# Patient Record
Sex: Male | Born: 1945
Health system: Southern US, Community
[De-identification: ages and names within clinical notes are randomized; demographics above are authoritative.]

## PROBLEM LIST (undated history)

## (undated) DIAGNOSIS — Z8619 Personal history of other infectious and parasitic diseases: Secondary | ICD-10-CM

## (undated) DIAGNOSIS — C4491 Basal cell carcinoma of skin, unspecified: Secondary | ICD-10-CM

## (undated) DIAGNOSIS — I10 Essential (primary) hypertension: Secondary | ICD-10-CM

## (undated) DIAGNOSIS — S43006A Unspecified dislocation of unspecified shoulder joint, initial encounter: Secondary | ICD-10-CM

## (undated) DIAGNOSIS — E785 Hyperlipidemia, unspecified: Secondary | ICD-10-CM

## (undated) DIAGNOSIS — D649 Anemia, unspecified: Secondary | ICD-10-CM

## (undated) HISTORY — DX: Anemia, unspecified: D64.9

## (undated) HISTORY — PX: GLAUCOMA SURGERY: SHX656

## (undated) HISTORY — PX: CYST REMOVAL LEG: SHX6280

## (undated) HISTORY — DX: Personal history of other infectious and parasitic diseases: Z86.19

## (undated) HISTORY — DX: Unspecified dislocation of unspecified shoulder joint, initial encounter: S43.006A

## (undated) HISTORY — PX: BACK SURGERY: SHX140

## (undated) HISTORY — DX: Hyperlipidemia, unspecified: E78.5

## (undated) HISTORY — PX: HAND SURGERY: SHX662

## (undated) HISTORY — PX: COLONOSCOPY: SHX174

## (undated) HISTORY — DX: Basal cell carcinoma of skin, unspecified: C44.91

## (undated) HISTORY — PX: OTHER SURGICAL HISTORY: SHX169

## (undated) HISTORY — PX: ADENOIDECTOMY: SUR15

## (undated) HISTORY — PX: TONSILLECTOMY: SUR1361

---

## 2004-03-14 DIAGNOSIS — K5730 Diverticulosis of large intestine without perforation or abscess without bleeding: Secondary | ICD-10-CM | POA: Insufficient documentation

## 2007-05-20 ENCOUNTER — Ambulatory Visit: Payer: Self-pay

## 2009-01-24 DIAGNOSIS — G4733 Obstructive sleep apnea (adult) (pediatric): Secondary | ICD-10-CM | POA: Insufficient documentation

## 2009-11-23 ENCOUNTER — Ambulatory Visit: Payer: Self-pay | Admitting: Family Medicine

## 2010-02-01 DIAGNOSIS — N529 Male erectile dysfunction, unspecified: Secondary | ICD-10-CM | POA: Insufficient documentation

## 2011-04-03 ENCOUNTER — Ambulatory Visit (HOSPITAL_COMMUNITY): Payer: Commercial Managed Care - PPO

## 2011-04-03 ENCOUNTER — Ambulatory Visit (HOSPITAL_COMMUNITY)
Admission: RE | Admit: 2011-04-03 | Discharge: 2011-04-03 | Disposition: A | Discharge status: Home / Self Care | Payer: Commercial Managed Care - PPO | Source: Ambulatory Visit | Attending: Ophthalmology | Admitting: Ophthalmology

## 2011-04-03 DIAGNOSIS — H4010X0 Unspecified open-angle glaucoma, stage unspecified: Secondary | ICD-10-CM | POA: Insufficient documentation

## 2011-04-03 DIAGNOSIS — I10 Essential (primary) hypertension: Secondary | ICD-10-CM | POA: Insufficient documentation

## 2011-04-03 LAB — CBC
Hemoglobin: 15.4 g/dL (ref 13.0–17.0)
MCV: 91 fL (ref 78.0–100.0)
Platelets: 207 10*3/uL (ref 150–400)
RBC: 4.77 MIL/uL (ref 4.22–5.81)
WBC: 7.6 10*3/uL (ref 4.0–10.5)

## 2011-04-03 LAB — BASIC METABOLIC PANEL
CO2: 24 mEq/L (ref 19–32)
Chloride: 106 mEq/L (ref 96–112)
Glucose, Bld: 110 mg/dL — ABNORMAL HIGH (ref 70–99)
Sodium: 141 mEq/L (ref 135–145)

## 2011-04-03 LAB — SURGICAL PCR SCREEN
MRSA, PCR: NEGATIVE
Staphylococcus aureus: NEGATIVE

## 2011-06-05 ENCOUNTER — Ambulatory Visit: Payer: Self-pay | Admitting: Family Medicine

## 2011-06-13 NOTE — Op Note (Signed)
NAMEGLENDA, KUNST NO.:  [id number]  MEDICAL RECORD NO.:  [id number]  LOCATION:  SDSC                         FACILITY:  [facility]  PHYSICIAN:  Chalmers Guest, M.D.     DATE OF BIRTH:  18-Dec-1945  DATE OF PROCEDURE:  04/03/2011 DATE OF DISCHARGE:  04/03/2011                              OPERATIVE REPORT   PREOPERATIVE DIAGNOSIS:  Uncontrolled open-angle glaucoma, left eye despite maximum tolerated medical therapy and maximum laser therapy.  POSTOPERATIVE DIAGNOSIS:  Uncontrolled open-angle glaucoma, left eye despite maximum tolerated medical therapy and maximum laser therapy.  PROCEDURE:  Express mini glaucoma shunt with mitomycin C.  COMPLICATIONS:  None.  ANESTHESIA:  2% Xylocaine with epinephrine and 50:50 mixture 0.75% Marcaine with an ampule of Wydase.  PROCEDURE IN DETAIL:  The patient was given a peribulbar block with the aforementioned local anesthetic agent in the operating room under monitored anesthesia.  Pressure was applied to the eye and the patient's face was prepped and draped in usual sterile fashion.  Following this with the surgeon sitting superiorly and a lid speculum in place, a 6-0 silk suture was passed through clear cornea to infraduct the eye. With the eye in infraductal position, an incision was made with a Westcott scissors at the corneal limbus extending laterally superior nasal. Following this, using a blunt scissors a fornix-based conjunctival flap was formed.  The patient felt a sensation therefore the block was supplemented on the posterior globe subconjunctivally.  Following this, the Tenon's fibers were recessed with a Tooke blade, bleeding was controlled with cautery, 45-degree blade was used to fashion a partial thickness scleral flap.  Following this, the Maumenee-Colibri forceps were used and a Grieshaber 5700 blade was used to dissect up to the corneoscleral limbus.  Again bleeding was controlled with  cautery. Following this, mitomycin C 0.4 mg/mL was placed on Gelfoam sponge and allowed to stay under the conjunctiva for 2 minutes and then removed and irrigated with 40 mL of balanced salt solution.  At this point, a wheeler blade was used through the inferior temporal clear cornea to the anterior chamber and Provisc was injected.  Following this, a 26-gauge needle was used on the scleral flap, passed into the anterior chamber injecting Provisc.  Then the Express mini shunt which was preloaded on EDS P200 shunt SN #[id number] was passed through the tract and rotated into position and released.  The scleral flap was then sutured with 4 interrupted 10-0 nylon suture.  The conjunctiva was closed with 9-0 Vicryl on BV 100 needle securing centrally.  BSS was injected.  Anterior chamber remained deep.  The shunt was in good position, slightly resting on the iris.  The bleb elevated was Seidel negative.  A subconjunctival injection of Kenalog 4 mg was given to inferior nasal quadrant.  Topical TobraDex was applied to the eye.  A patch of Fox shield were placed after all instrumentation had been removed from the eye and the patient returned to recovery area in stable condition.     Chalmers Guest, M.D.     RW/MEDQ  D:  04/03/2011  T:  04/04/2011  Job:  [id number]  cc:   [phone]  Electronically Signed by  Chalmers Guest M.D. on 06/13/2011 05:55:42 PM

## 2011-06-25 ENCOUNTER — Other Ambulatory Visit (HOSPITAL_COMMUNITY): Payer: Self-pay | Admitting: General Surgery

## 2011-06-25 DIAGNOSIS — N453 Epididymo-orchitis: Secondary | ICD-10-CM

## 2011-06-27 ENCOUNTER — Other Ambulatory Visit (HOSPITAL_COMMUNITY): Payer: Commercial Managed Care - PPO

## 2011-06-27 ENCOUNTER — Inpatient Hospital Stay (HOSPITAL_COMMUNITY): Admission: RE | Admit: 2011-06-27 | Payer: Commercial Managed Care - PPO | Source: Ambulatory Visit

## 2011-12-18 ENCOUNTER — Ambulatory Visit: Payer: Self-pay | Admitting: Family Medicine

## 2012-05-29 DIAGNOSIS — H269 Unspecified cataract: Secondary | ICD-10-CM | POA: Insufficient documentation

## 2012-05-29 DIAGNOSIS — K219 Gastro-esophageal reflux disease without esophagitis: Secondary | ICD-10-CM | POA: Insufficient documentation

## 2012-05-29 DIAGNOSIS — H409 Unspecified glaucoma: Secondary | ICD-10-CM | POA: Insufficient documentation

## 2012-09-13 IMAGING — CR DG CHEST 2V
2 series · 2 of 2 positions shown · non-contrast
Comparison: None.

CLINICAL DATA: Preop.

CHEST - 2 VIEW

[view not recorded (1 of 2)]
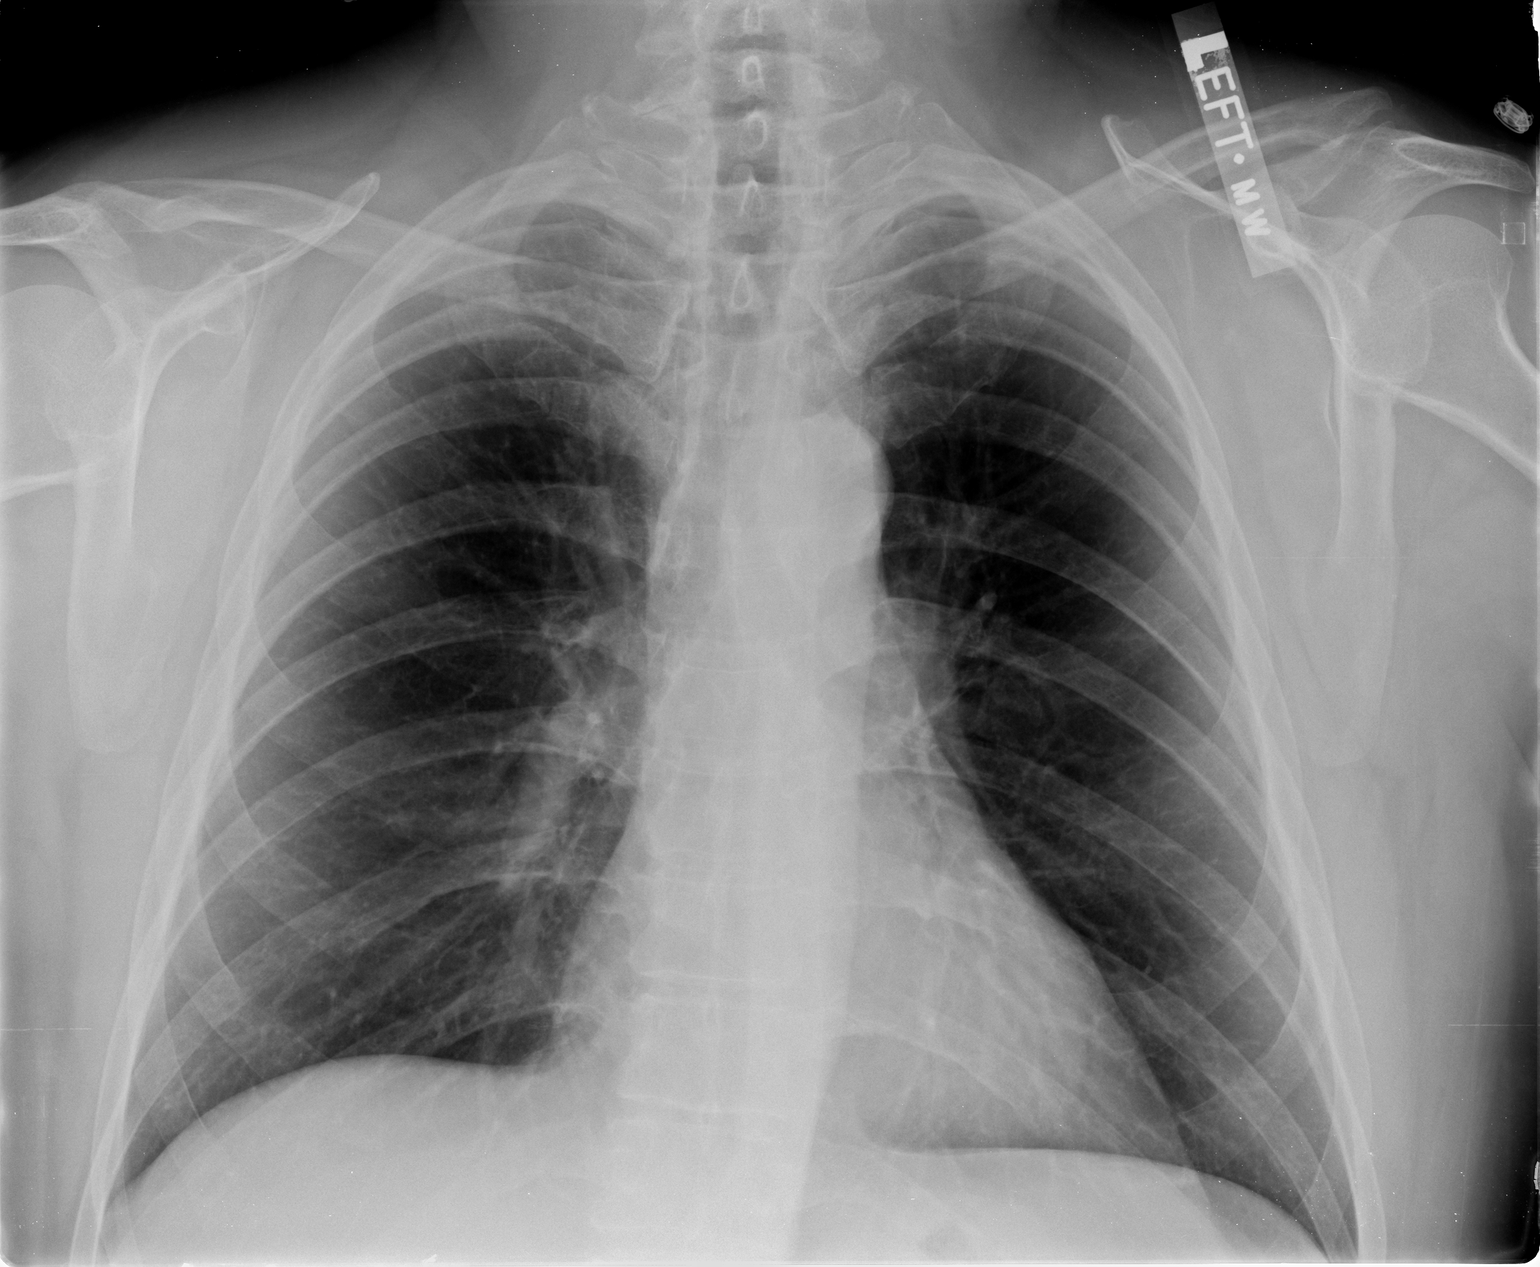

[view not recorded (2 of 2)]
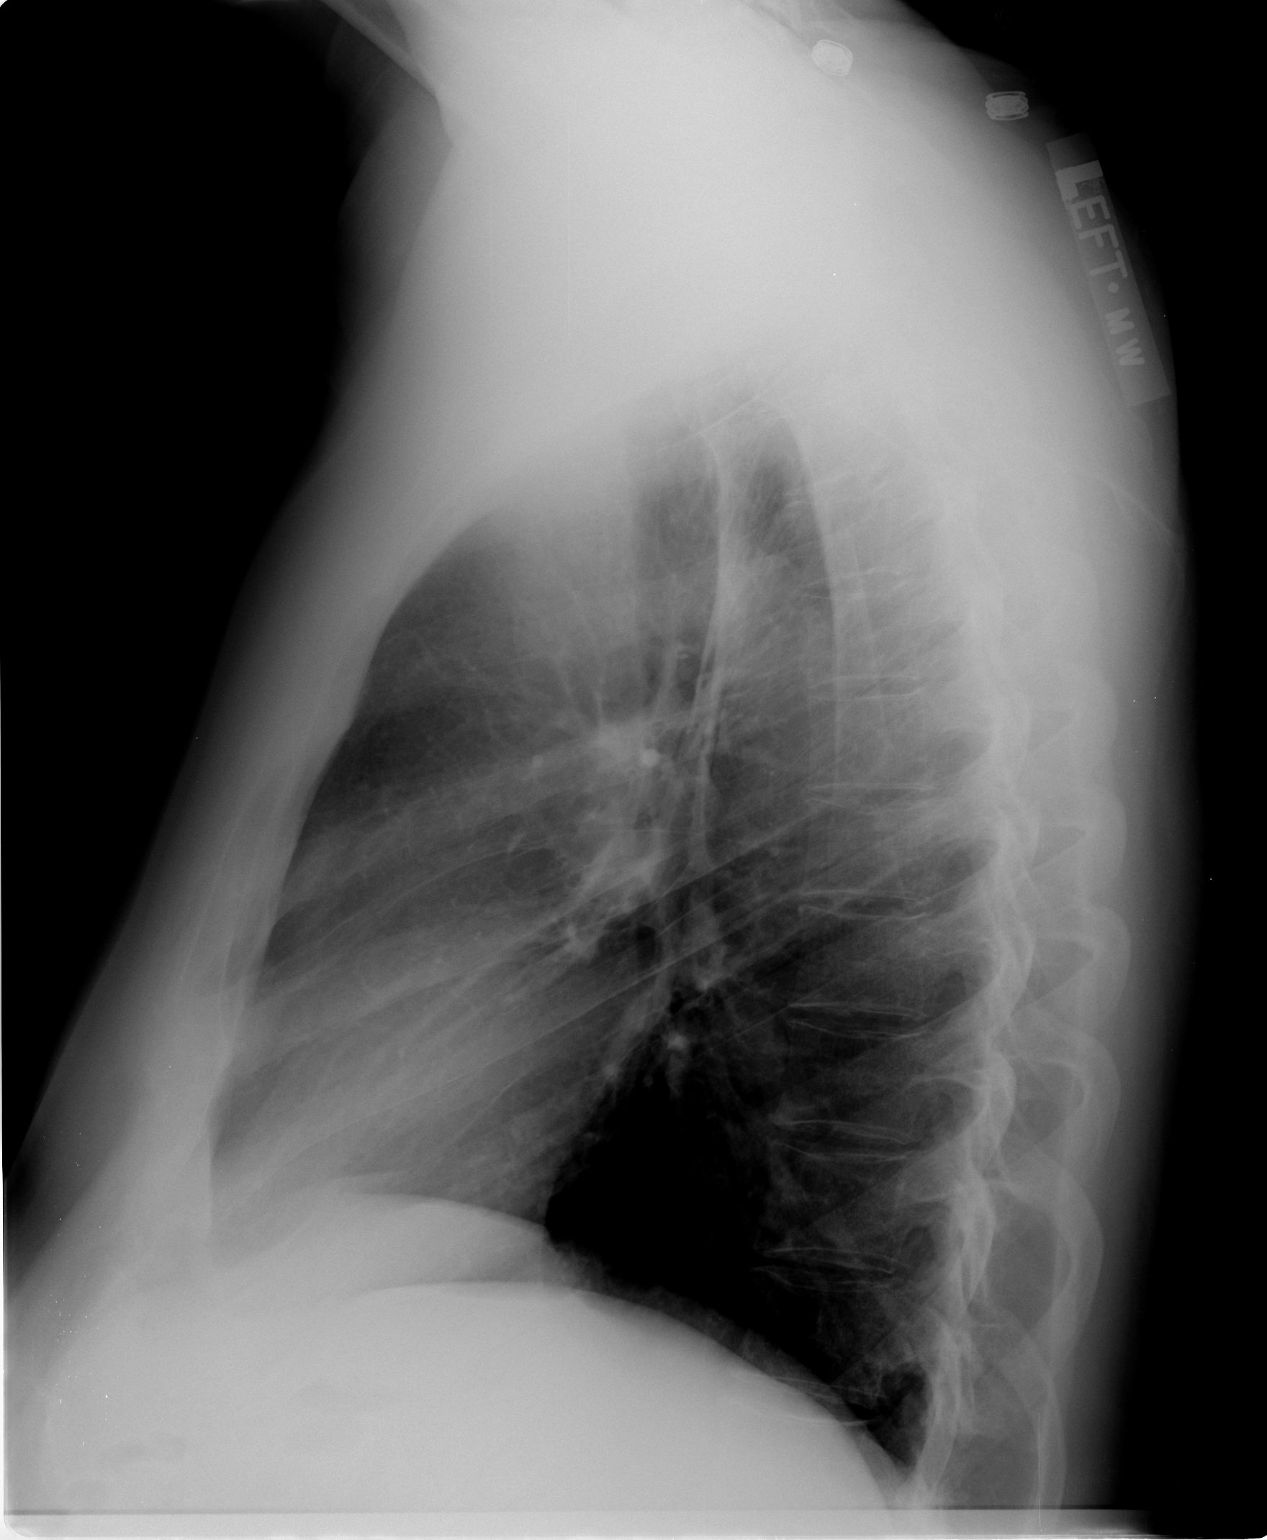

[2 of 2 positions shown; findings below may reference images not displayed]

FINDINGS: Trachea is midline.  Heart size normal.  Lungs are clear.
No pleural fluid.
IMPRESSION: No acute findings.

## 2012-11-15 IMAGING — US US CAROTID DUPLEX BILAT
1 series · 17 of 24 positions shown · non-contrast
Comparison: None

REASON FOR EXAM: neck pain  facial weakness
COMMENTS:

PROCEDURE:     GURGANUS - GURGANUS CAROTID DOPPLER BILATERAL  - June 05, 2011  [DATE]
RESULT:     Indication: Neck neck pain, facial weakness
TECHNIQUE: Gray-scale, color Doppler, and spectral Doppler images were
obtained of the extracranial carotid artery systems and vertebral arteries
in the neck.

[Series 1: us carotid duplex bilat · 17 of 73 slices shown]
[im 1/73]
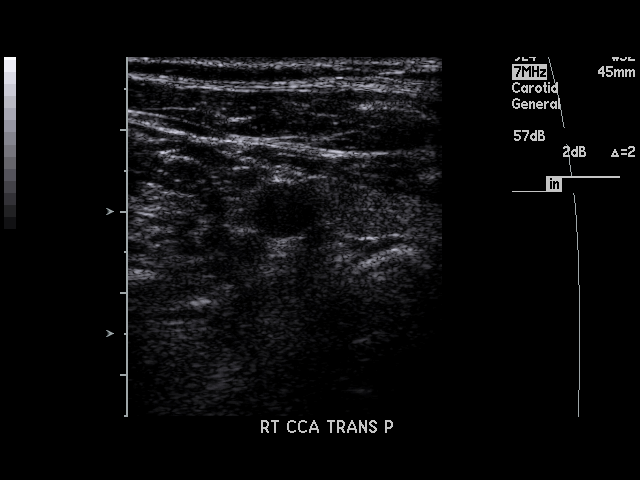
[im 7/73]
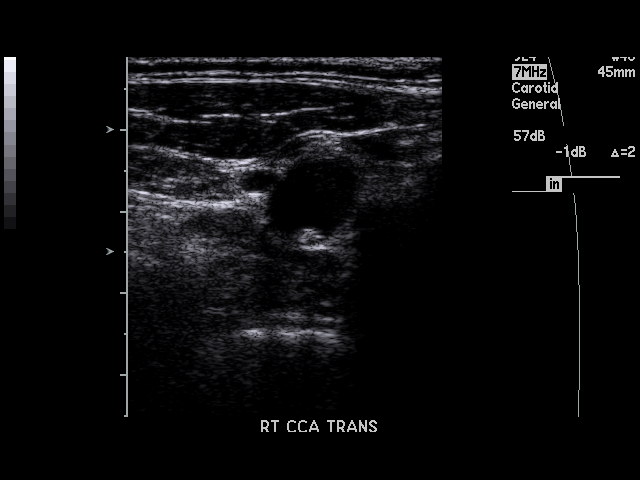
[im 10/73]
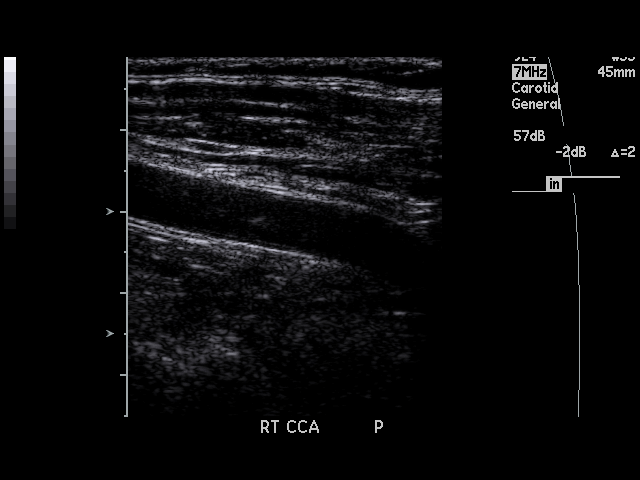
[im 13/73]
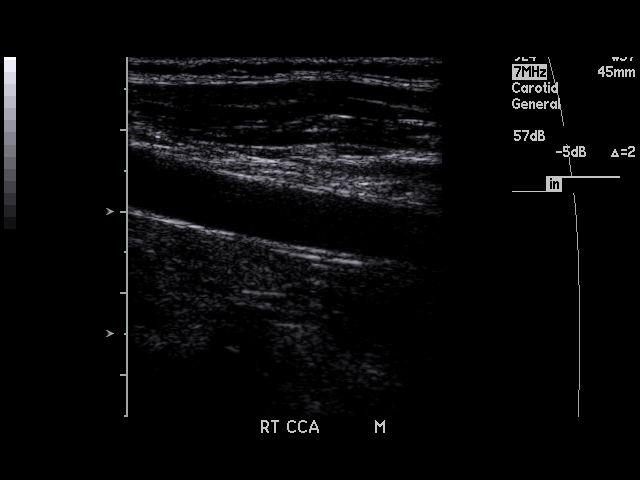
[im 19/73]
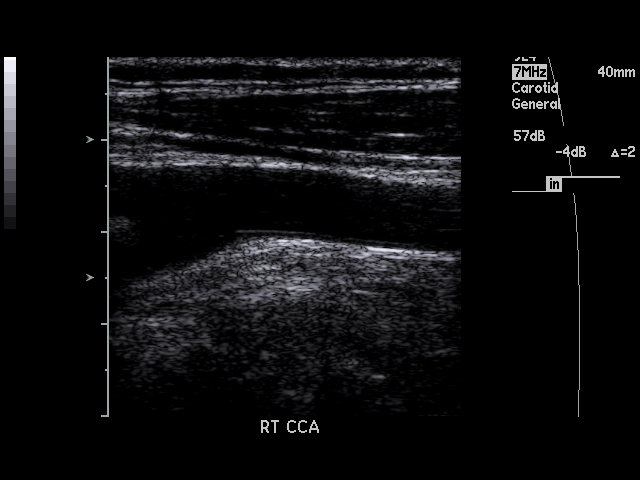
[im 22/73]
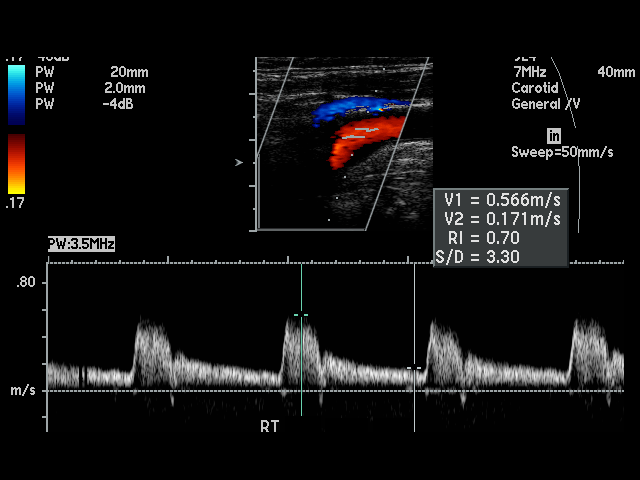
[im 29/73]
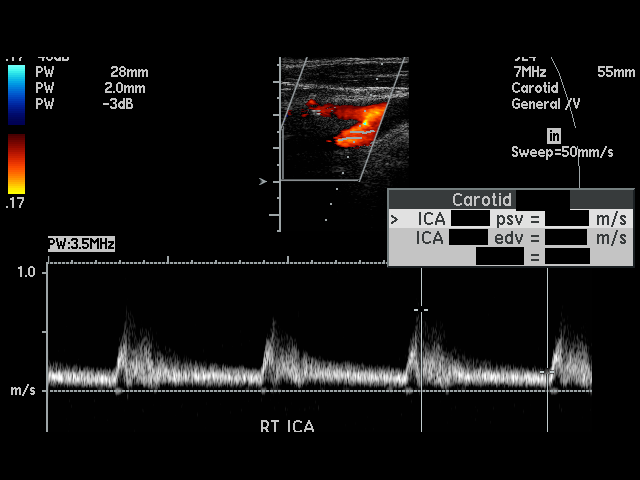
[im 32/73]
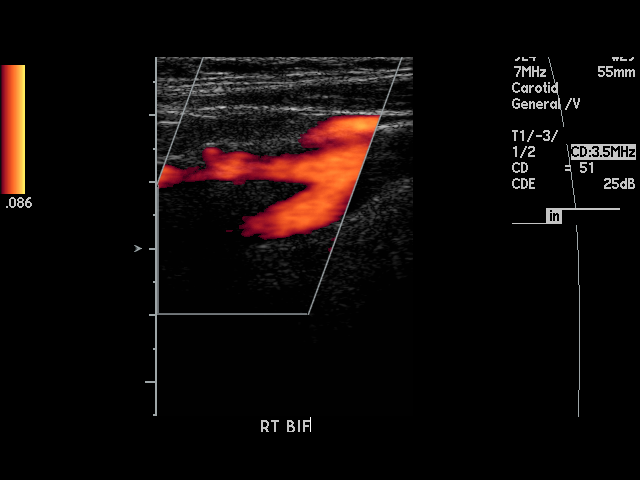
[im 38/73]
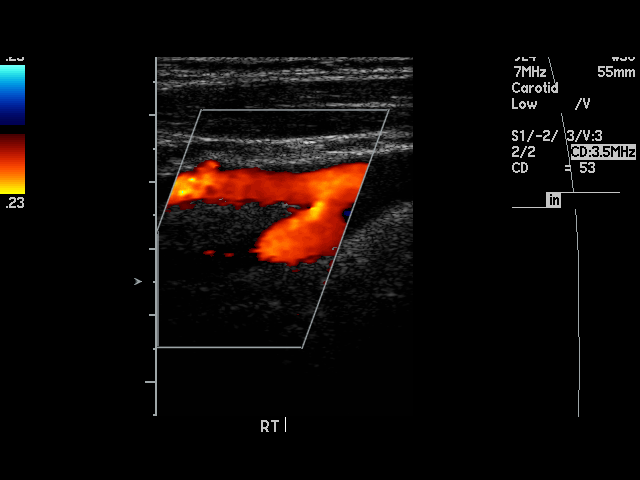
[im 41/73]
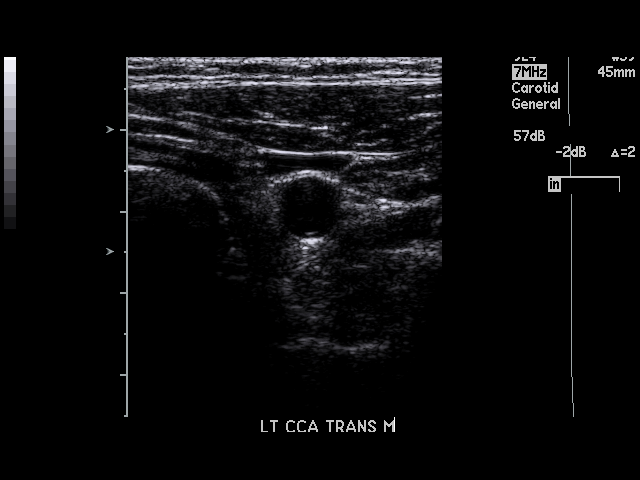
[im 44/73]
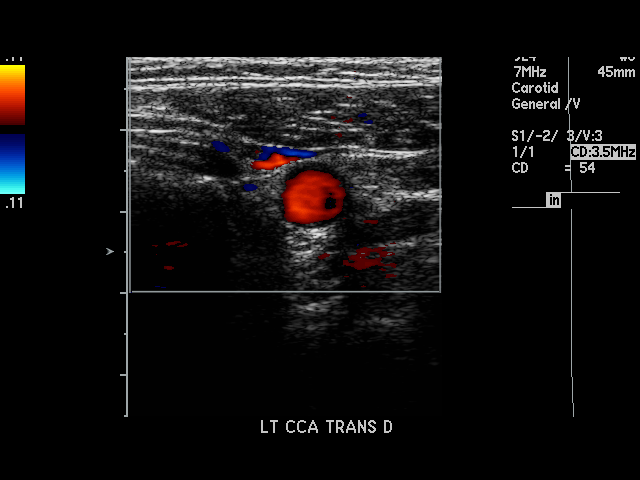
[im 51/73]
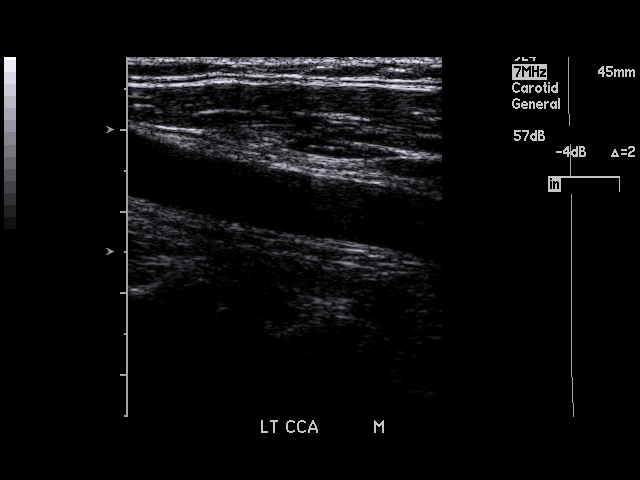
[im 54/73]
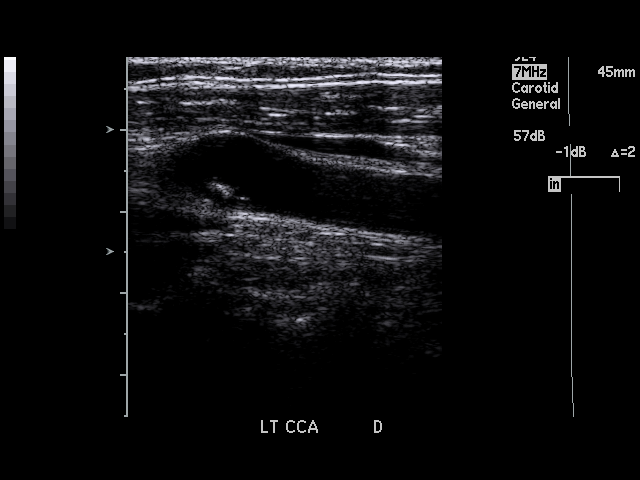
[im 60/73]
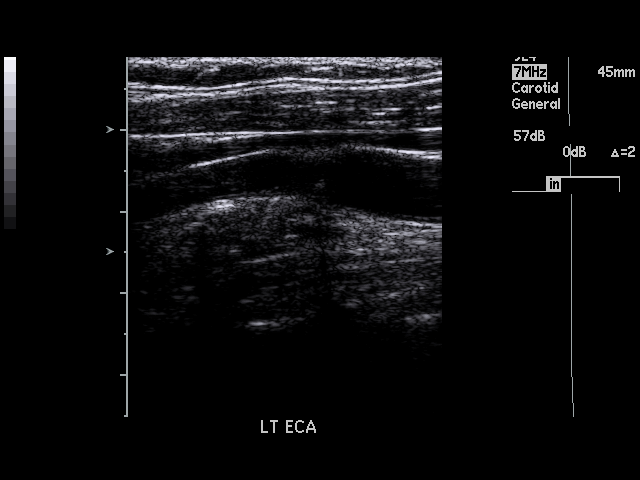
[im 63/73]
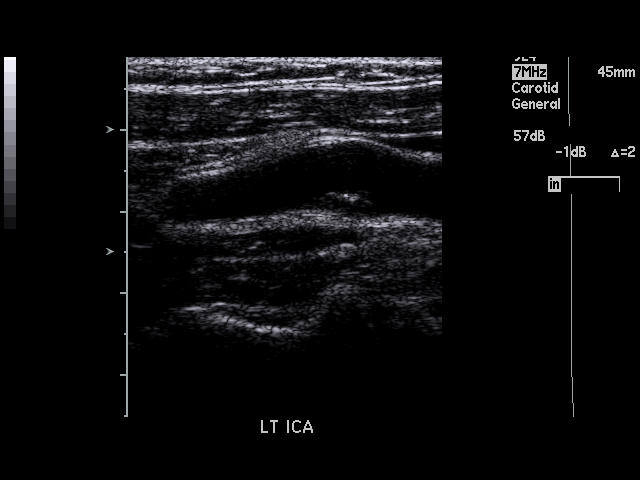
[im 66/73]
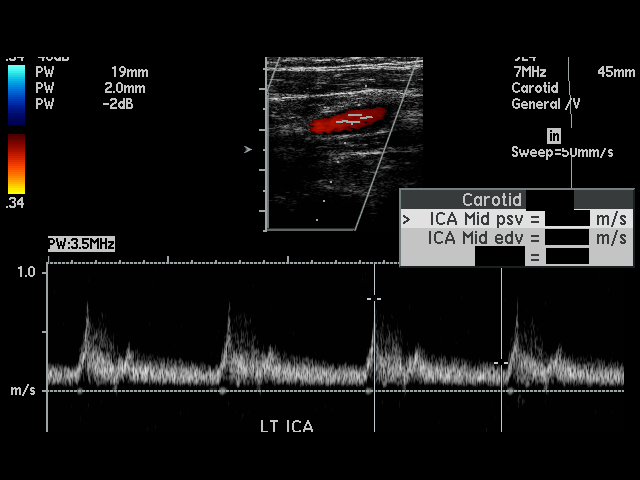
[im 73/73]
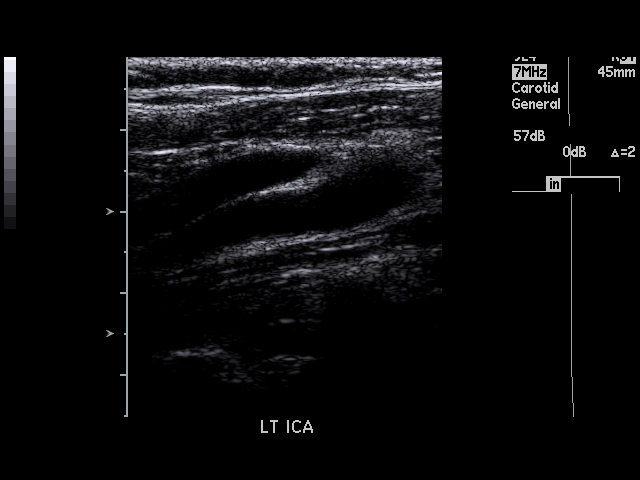

[17 of 24 positions shown; findings below may reference images not displayed]

FINDINGS: On the right, there is mild atherosclerotic plaque within the carotid bulb
and proximal ICA. Maximum peak systolic velocity in the right CCA is 83
cm/second. Maximum peak systolic velocity in the right ICA is 72 cm/second.
Maximum peak systolic velocity in the right ECA is 111 cm/second. The right
ICA/CCA ratio is 0.87. This corresponds to a stenosis of less than 50 %.
Antegrade blood flow is documented in the right vertebral artery.

On the left, there is mild atherosclerotic plaque within the carotid bulb.
Maximum peak systolic velocity in the left CCA is 72 cm/second. Maximum peak
systolic velocity in the left ICA is 74 cm/second. Maximum peak systolic
velocity in the left ECA is 93 cm/second. The left ICA/CCA ratio is 1.04.
This corresponds to a stenosis of less than 50 %. Antegrade blood flow is
documented in the left vertebral artery.
IMPRESSION: 1. No hemodynamically significant carotid artery stenosis.

## 2013-03-13 ENCOUNTER — Emergency Department (HOSPITAL_COMMUNITY)
Admission: EM | Admit: 2013-03-13 | Discharge: 2013-03-13 | Disposition: A | Discharge status: Home / Self Care | Payer: Commercial Managed Care - PPO | Attending: Emergency Medicine | Admitting: Emergency Medicine

## 2013-03-13 ENCOUNTER — Emergency Department (HOSPITAL_COMMUNITY): Payer: Commercial Managed Care - PPO

## 2013-03-13 ENCOUNTER — Encounter (HOSPITAL_COMMUNITY): Payer: Self-pay | Admitting: Emergency Medicine

## 2013-03-13 DIAGNOSIS — R0789 Other chest pain: Secondary | ICD-10-CM | POA: Insufficient documentation

## 2013-03-13 DIAGNOSIS — Z91040 Latex allergy status: Secondary | ICD-10-CM | POA: Insufficient documentation

## 2013-03-13 DIAGNOSIS — E7800 Pure hypercholesterolemia, unspecified: Secondary | ICD-10-CM | POA: Insufficient documentation

## 2013-03-13 DIAGNOSIS — Z79899 Other long term (current) drug therapy: Secondary | ICD-10-CM | POA: Insufficient documentation

## 2013-03-13 DIAGNOSIS — I10 Essential (primary) hypertension: Secondary | ICD-10-CM | POA: Insufficient documentation

## 2013-03-13 DIAGNOSIS — Z7982 Long term (current) use of aspirin: Secondary | ICD-10-CM | POA: Insufficient documentation

## 2013-03-13 HISTORY — DX: Essential (primary) hypertension: I10

## 2013-03-13 LAB — CBC WITH DIFFERENTIAL/PLATELET
Basophils Absolute: 0 10*3/uL (ref 0.0–0.1)
HCT: 45.4 % (ref 39.0–52.0)
Hemoglobin: 16.1 g/dL (ref 13.0–17.0)
Lymphocytes Relative: 24 % (ref 12–46)
Lymphs Abs: 2.1 10*3/uL (ref 0.7–4.0)
Monocytes Absolute: 0.7 10*3/uL (ref 0.1–1.0)
Monocytes Relative: 8 % (ref 3–12)
Neutro Abs: 5.8 10*3/uL (ref 1.7–7.7)
RBC: 4.92 MIL/uL (ref 4.22–5.81)
RDW: 12.8 % (ref 11.5–15.5)
WBC: 8.7 10*3/uL (ref 4.0–10.5)

## 2013-03-13 LAB — BASIC METABOLIC PANEL
CO2: 25 mEq/L (ref 19–32)
Chloride: 103 mEq/L (ref 96–112)
GFR calc Af Amer: >90 mL/min (ref >90)
Potassium: 4 mEq/L (ref 3.5–5.1)
Sodium: 137 mEq/L (ref 135–145)

## 2013-03-13 MED ORDER — ASPIRIN 81 MG PO CHEW
324.0000 mg | CHEWABLE_TABLET | Freq: Once | ORAL | Status: AC
  Administered 2013-03-13: 324 mg via ORAL
  Filled 2013-03-13: qty 4

## 2013-03-13 NOTE — ED Notes (Signed)
Pt returned from X-ray.  

## 2013-03-13 NOTE — ED Provider Notes (Signed)
History    CSN: [id number] Arrival date & time 03/13/13  1049  First MD Initiated Contact with Patient 03/13/13 1102     Chief Complaint  Patient presents with  . Chest Pain  . Hypertension   (Consider location/radiation/quality/duration/timing/severity/associated sxs/prior Treatment) HPI Comments: 67 yo male with HTN hx, no other cardiac risk factors, no PE risk factors presents with left anterior chest vibration intermittent since yesterday. No palpitations or pain/ pressure.  No hx of similar.  No exertional or diaphoretic component, no sob or pain. Patient denies blood clot history, active cancer, recent major trauma or surgery, unilateral leg swelling/ pain, recent long travel, hemoptysis or oral contraceptives. Pt feels great, no sxs when he exercises regularly.  No known heart issues.  Occurs every 20 min and lasts seconds, superficial feeling.   Patient is a 67 y.o. male presenting with chest pain and hypertension. The history is provided by the patient.  Chest Pain Pain location:  L chest Associated symptoms: no abdominal pain, no back pain, no cough, no fever, no headache, no shortness of breath and not vomiting   Hypertension Associated symptoms include chest pain. Pertinent negatives include no abdominal pain, no headaches and no shortness of breath.   Past Medical History  Diagnosis Date  . Hypertension   . Hypercholesteremia    History reviewed. No pertinent past surgical history. History reviewed. No pertinent family history. History  Substance Use Topics  . Smoking status: Never Smoker   . Smokeless tobacco: Not on file  . Alcohol Use: Yes    Review of Systems  Constitutional: Negative for fever and chills.  HENT: Negative for neck pain and neck stiffness.   Eyes: Negative for visual disturbance.  Respiratory: Negative for cough and shortness of breath.   Cardiovascular: Positive for chest pain. Negative for leg swelling.  Gastrointestinal: Negative for  vomiting and abdominal pain.  Genitourinary: Negative for dysuria and flank pain.  Musculoskeletal: Negative for back pain.  Skin: Negative for rash.  Neurological: Negative for light-headedness and headaches.    Allergies  Latex  Home Medications   Current Outpatient Rx  Name  Route  Sig  Dispense  Refill  . aspirin 81 MG tablet   Oral   Take 81 mg by mouth daily.         . bisoprolol-hydrochlorothiazide (ZIAC) 2.5-6.25 MG per tablet   Oral   Take 1 tablet by mouth every evening.         . cetirizine (ZYRTEC) 10 MG tablet   Oral   Take 5 mg by mouth daily.         . famotidine (PEPCID) 10 MG tablet   Oral   Take 10 mg by mouth at bedtime.         . fluvastatin (LESCOL) 20 MG capsule   Oral   Take 20 mg by mouth at bedtime.         Marland Kitchen glucosamine-chondroitin 500-400 MG tablet   Oral   Take 1 tablet by mouth 2 (two) times daily.         . Multiple Vitamin (MULTIVITAMIN WITH MINERALS) TABS   Oral   Take 1 tablet by mouth daily.         . Omega-3 Fatty Acids (FISH OIL) 1200 MG CAPS   Oral   Take 2,400 mg by mouth 2 (two) times daily.         . Potassium (POTASSIMIN PO)   Oral   Take 500 mg by mouth  daily.         . vitamin B-12 (CYANOCOBALAMIN) 500 MCG tablet   Oral   Take 500 mcg by mouth daily.          BP 210/166  Pulse 57  Temp(Src) 98.1 F (36.7 C) (Oral)  Resp 22  SpO2 97% Physical Exam  Nursing note and vitals reviewed. Constitutional: He is oriented to person, place, and time. He appears well-developed and well-nourished.  HENT:  Head: Normocephalic and atraumatic.  Eyes: Conjunctivae are normal. Right eye exhibits no discharge. Left eye exhibits no discharge.  Neck: Normal range of motion. Neck supple. No tracheal deviation present.  Cardiovascular: Normal rate, regular rhythm and intact distal pulses.   No murmur heard. Pulmonary/Chest: Effort normal and breath sounds normal.  Abdominal: Soft. He exhibits no distension.  There is no tenderness. There is no guarding.  Musculoskeletal: He exhibits no edema and no tenderness.  Neurological: He is alert and oriented to person, place, and time.  Skin: Skin is warm. No rash noted.  Psychiatric: He has a normal mood and affect.    ED Course  Procedures (including critical care time) Labs Reviewed  BASIC METABOLIC PANEL - Abnormal; Notable for the following:    Glucose, Bld 102 (*)    GFR calc non Af Amer 86 (*)    All other components within normal limits  CBC WITH DIFFERENTIAL  TROPONIN I  TROPONIN I   Dg Chest 2 View  03/13/2013   *RADIOLOGY REPORT*  Clinical Data: Chest pain, hypertension, feels vibrations in chest  CHEST - 2 VIEW  Comparison: 04/03/2011  Findings: The heart size and vascular pattern are normal and unchanged from prior study.  The lungs are clear.  There are no pleural effusions.  Mediastinal contours are normal.  IMPRESSION: Normal study   Original Report Authenticated By: Esperanza Heir, M.D.   1. Chest discomfort     MDM  No zoster rash. ASA given. Work up in ED unremarkable.  Rechecked twice, no sxs.  Date: 03/13/2013  Rate: 55  Rhythm: normal sinus rhythm  QRS Axis: normal  Intervals: normal  ST/T Wave abnormalities: normal  Conduction Disutrbances:none  Narrative Interpretation:   Old EKG Reviewed: unchanged  Discussed broad differential for atypical vibrations sense, no pain in ed or sob.  Initial htn read likely not accurate, multiple rechecks showed normal or near normal bp.  Pt will fup for recheck for bp. Discussed observation vs close outpt fup, pt understands this may be his heart, he has capacity to make Decisions and prefers close outpt fup for stress test and re-eval. Delta trop neg.  DC Rec asa until seen.    Enid Skeens, MD 03/13/13 [phone]

## 2013-03-13 NOTE — ED Notes (Signed)
Pt c/o left sided chest pressure; pt denies pain but sts feels funny; pt sts some exertional SOB; pt noted to be hypertensive; pt sts normally under control with meds

## 2013-03-18 ENCOUNTER — Encounter: Payer: Self-pay | Admitting: *Deleted

## 2013-03-19 ENCOUNTER — Telehealth: Payer: Self-pay | Admitting: *Deleted

## 2013-03-19 ENCOUNTER — Ambulatory Visit (INDEPENDENT_AMBULATORY_CARE_PROVIDER_SITE_OTHER): Payer: Commercial Managed Care - PPO | Admitting: Cardiovascular Disease

## 2013-03-19 ENCOUNTER — Institutional Professional Consult (permissible substitution): Payer: Self-pay | Admitting: Neurology

## 2013-03-19 ENCOUNTER — Encounter: Payer: Self-pay | Admitting: Cardiovascular Disease

## 2013-03-19 VITALS — BP 108/72 | HR 53 | Ht 74.0 in | Wt 214.8 lb

## 2013-03-19 DIAGNOSIS — R079 Chest pain, unspecified: Secondary | ICD-10-CM | POA: Insufficient documentation

## 2013-03-19 DIAGNOSIS — I4892 Unspecified atrial flutter: Secondary | ICD-10-CM

## 2013-03-19 DIAGNOSIS — R002 Palpitations: Secondary | ICD-10-CM | POA: Insufficient documentation

## 2013-03-19 DIAGNOSIS — I10 Essential (primary) hypertension: Secondary | ICD-10-CM | POA: Insufficient documentation

## 2013-03-19 NOTE — Telephone Encounter (Signed)
Message copied by Kendrick Fries on Fri Mar 19, 2013  3:48 PM ------      Message from: Lisabeth Devoid F      Created: Fri Mar 19, 2013  3:37 PM      Regarding: 48 hour monitor       48 hour monitor      :) ------

## 2013-03-19 NOTE — Assessment & Plan Note (Signed)
His symptoms are suggestive of premature beats. Baseline ECG is normal. I recommend a 48 hour work to monitor for evaluation. I don't period of monitoring can be considered in the future if he develops recurrent symptoms with no definite diagnosis.

## 2013-03-19 NOTE — Assessment & Plan Note (Signed)
Blood pressure was previously elevated during his emergency room visit. However, it has been back to normal since then.

## 2013-03-19 NOTE — Telephone Encounter (Signed)
Faxed order for 48 hour holter monitor to [place]. They will contact pt to have pt come in for 48 hour holter monitor.

## 2013-03-19 NOTE — Assessment & Plan Note (Signed)
He has a chest discomfort with palpitations. I think it's important to rule out ischemic heart disease as an etiology. Thus, I recommend a stress echocardiogram. The imaging portion will help determine his cardiac function and ensure no significant structural abnormalities.

## 2013-03-19 NOTE — Patient Instructions (Addendum)
Your physician has requested that you have a stress echocardiogram. For further information please visit [email or web address] Please follow instruction sheet as given.  Your physician has recommended that you wear a holter monitor. Holter monitors are medical devices that record the heart's electrical activity. Doctors most often use these monitors to diagnose arrhythmias. Arrhythmias are problems with the speed or rhythm of the heartbeat. The monitor is a small, portable device. You can wear one while you do your normal daily activities. This is usually used to diagnose what is causing palpitations/syncope (passing out).  Follow up as needed

## 2013-03-19 NOTE — Progress Notes (Signed)
HPI  This is a pleasant 67 year old man who was referred by Dr. Sherrie Mustache for evaluation of palpitations and chest pain. He has no previous cardiac history. He has known history of hypertension and hyperlipidemia which are both well controlled on medications. He has no history of diabetes or tobacco use. Recently, specifically on July 19, he started having intermittent episodes of fluttering in his chest and "vibrations"  Lasting for short period of time and associated with vague discomfort substernally. He had no shortness of breath, dizziness, syncope or presyncope. These episodes happen at rest and did not worsen with physical activities. He went to the emergency room at [facility]. Routine labs were unremarkable. EKG showed no acute changes. Cardiac enzymes were negative. He was noted to be hypertensive with a blood pressure greater than 200 which came down spontaneously. He was discharged home to followup with cardiology. Since hospital discharge, he reports no recurrent symptoms. She has no history of thyroid disease. He consumes 1-2 cups of coffee a day.  Allergies  Allergen Reactions  . Latex Itching  . Lipitor (Atorvastatin)   . Pravachol (Pravastatin Sodium)      Current Outpatient Prescriptions on File Prior to Visit  Medication Sig Dispense Refill  . aspirin 81 MG tablet Take 81 mg by mouth daily.      . bisoprolol-hydrochlorothiazide (ZIAC) 2.5-6.25 MG per tablet Take 1 tablet by mouth every evening.      . cetirizine (ZYRTEC) 10 MG tablet Take 5 mg by mouth daily.      . famotidine (PEPCID) 10 MG tablet Take 10 mg by mouth at bedtime.      . fluvastatin (LESCOL) 20 MG capsule Take 20 mg by mouth at bedtime.      Marland Kitchen glucosamine-chondroitin 500-400 MG tablet Take 1 tablet by mouth 2 (two) times daily.      . Multiple Vitamin (MULTIVITAMIN WITH MINERALS) TABS Take 1 tablet by mouth daily.      . Potassium (POTASSIMIN PO) Take 595 mg by mouth 2 (two) times daily.        No current  facility-administered medications on file prior to visit.     Past Medical History  Diagnosis Date  . Hypertension   . Hypercholesteremia   . Arthritis   . Facial weakness   . Acute neck pain   . Glaucoma   . Diverticula of colon   . Cancer     basel cell skin cancer     Past Surgical History  Procedure Laterality Date  . Back surgery    . Tonsillectomy    . Adenoidectomy    . Hand surgery    . Glaucoma surgery    . Cataract surgery    . Colonoscopy    . Bone graph    . Cyst removal leg Left      Family History  Problem Relation Age of Onset  . Heart disease Sister      History   Social History  . Marital Status: Married    Spouse Name: N/A    Number of Children: N/A  . Years of Education: N/A   Occupational History  . Not on file.   Social History Main Topics  . Smoking status: Former Smoker -- 0.00 packs/day for 7 years    Types: Cigarettes  . Smokeless tobacco: Not on file  . Alcohol Use: 1.8 oz/week    3 Cans of beer per week     Comment: daily  . Drug Use: No  .  Sexually Active: Not on file   Other Topics Concern  . Not on file   Social History Narrative  . No narrative on file     ROS Constitutional: Negative for fever, chills, diaphoresis, activity change, appetite change and fatigue.  HENT: Negative for hearing loss, nosebleeds, congestion, sore throat, facial swelling, drooling, trouble swallowing, neck pain, voice change, sinus pressure and tinnitus.  Eyes: Negative for photophobia, pain, discharge and visual disturbance.  Respiratory: Negative for apnea, cough, shortness of breath and wheezing.  Cardiovascular: Negative for leg swelling.  Gastrointestinal: Negative for nausea, vomiting, abdominal pain, diarrhea, constipation, blood in stool and abdominal distention.  Genitourinary: Negative for dysuria, urgency, frequency, hematuria and decreased urine volume.  Musculoskeletal: Negative for myalgias, back pain, joint swelling,  arthralgias and gait problem.  Skin: Negative for color change, pallor, rash and wound.  Neurological: Negative for dizziness, tremors, seizures, syncope, speech difficulty, weakness, light-headedness, numbness and headaches.  Psychiatric/Behavioral: Negative for suicidal ideas, hallucinations, behavioral problems and agitation. The patient is not nervous/anxious.     PHYSICAL EXAM   BP 108/72  Pulse 53  Ht 6\' 2"  (1.88 m)  Wt 214 lb 12 oz (97.41 kg)  BMI 27.56 kg/m2 Constitutional: He is oriented to person, place, and time. He appears well-developed and well-nourished. No distress.  HENT: No nasal discharge.  Head: Normocephalic and atraumatic.  Eyes: Pupils are equal and round. Right eye exhibits no discharge. Left eye exhibits no discharge.  Neck: Normal range of motion. Neck supple. No JVD present. No thyromegaly present.  Cardiovascular: Normal rate, regular rhythm, normal heart sounds and. Exam reveals no gallop and no friction rub. No murmur heard.  Pulmonary/Chest: Effort normal and breath sounds normal. No stridor. No respiratory distress. He has no wheezes. He has no rales. He exhibits no tenderness.  Abdominal: Soft. Bowel sounds are normal. He exhibits no distension. There is no tenderness. There is no rebound and no guarding.  Musculoskeletal: Normal range of motion. He exhibits no edema and no tenderness.  Neurological: He is alert and oriented to person, place, and time. Coordination normal.  Skin: Skin is warm and dry. No rash noted. He is not diaphoretic. No erythema. No pallor.  Psychiatric: He has a normal mood and affect. His behavior is normal. Judgment and thought content normal.       ZOX:WRUEA  Bradycardia  WITHIN NORMAL LIMITS   ASSESSMENT AND PLAN

## 2013-03-22 ENCOUNTER — Other Ambulatory Visit (HOSPITAL_COMMUNITY): Payer: Commercial Managed Care - PPO

## 2013-03-22 ENCOUNTER — Ambulatory Visit: Payer: Commercial Managed Care - PPO | Admitting: Cardiology

## 2013-03-23 ENCOUNTER — Ambulatory Visit (HOSPITAL_COMMUNITY): Payer: Commercial Managed Care - PPO | Attending: Cardiology

## 2013-03-23 ENCOUNTER — Ambulatory Visit (HOSPITAL_BASED_OUTPATIENT_CLINIC_OR_DEPARTMENT_OTHER): Payer: Commercial Managed Care - PPO

## 2013-03-23 ENCOUNTER — Other Ambulatory Visit (HOSPITAL_COMMUNITY): Payer: Self-pay | Admitting: *Deleted

## 2013-03-23 DIAGNOSIS — Z87891 Personal history of nicotine dependence: Secondary | ICD-10-CM | POA: Insufficient documentation

## 2013-03-23 DIAGNOSIS — R0989 Other specified symptoms and signs involving the circulatory and respiratory systems: Secondary | ICD-10-CM

## 2013-03-23 DIAGNOSIS — I10 Essential (primary) hypertension: Secondary | ICD-10-CM | POA: Insufficient documentation

## 2013-03-23 DIAGNOSIS — R002 Palpitations: Secondary | ICD-10-CM | POA: Insufficient documentation

## 2013-03-23 DIAGNOSIS — H409 Unspecified glaucoma: Secondary | ICD-10-CM | POA: Insufficient documentation

## 2013-03-23 DIAGNOSIS — R079 Chest pain, unspecified: Secondary | ICD-10-CM

## 2013-03-23 DIAGNOSIS — E785 Hyperlipidemia, unspecified: Secondary | ICD-10-CM | POA: Insufficient documentation

## 2013-03-23 DIAGNOSIS — R072 Precordial pain: Secondary | ICD-10-CM

## 2013-03-23 MED ORDER — PERFLUTREN PROTEIN A MICROSPH IV SUSP
0.5000 mL | Freq: Once | INTRAVENOUS | Status: AC
  Administered 2013-03-23: 2 mL via INTRAVENOUS

## 2013-03-23 NOTE — Progress Notes (Signed)
Echocardiogram stress performed.

## 2013-03-24 ENCOUNTER — Encounter: Payer: Self-pay | Admitting: Cardiology

## 2013-03-26 DIAGNOSIS — R002 Palpitations: Secondary | ICD-10-CM

## 2013-04-01 ENCOUNTER — Telehealth: Payer: Self-pay

## 2013-04-01 NOTE — Telephone Encounter (Signed)
Pt would like results from monitor  °

## 2013-04-05 NOTE — Telephone Encounter (Signed)
Dr Kirke Corin is out of town for the week.  Had Dr Mariah Milling review holter monitor results.  Monitor showed some PVC's, but no significant arrhythmias. Rate was bradycardic in the 40's. Will have Dr Kirke Corin review when he returns from [place] and call back with any further recommendations.  LMOM TCB.  Spoke with pt advised of above.  Pt advised of above.  Will await Call back with Dr Jari Sportsman recommendations.

## 2013-04-06 NOTE — Telephone Encounter (Signed)
Called spoke with pt DR Kirke Corin read monitor results NSR, mild sinus bradycardia, few PVC's and PAC's, no significant arrhythmias.  Advised pt TCB if symptomatic with low HR for appt to discuss tx options. Pt verbalized understanding.

## 2013-04-07 ENCOUNTER — Encounter (INDEPENDENT_AMBULATORY_CARE_PROVIDER_SITE_OTHER): Payer: Commercial Managed Care - PPO

## 2013-04-07 DIAGNOSIS — R002 Palpitations: Secondary | ICD-10-CM

## 2013-05-13 ENCOUNTER — Encounter: Payer: Self-pay | Admitting: Neurology

## 2013-05-14 ENCOUNTER — Encounter: Payer: Self-pay | Admitting: *Deleted

## 2013-05-14 ENCOUNTER — Encounter: Payer: Self-pay | Admitting: Neurology

## 2013-05-14 ENCOUNTER — Ambulatory Visit (INDEPENDENT_AMBULATORY_CARE_PROVIDER_SITE_OTHER): Payer: Commercial Managed Care - PPO | Admitting: Neurology

## 2013-05-14 VITALS — BP 126/76 | Ht 73.0 in | Wt 217.0 lb

## 2013-05-14 DIAGNOSIS — R0683 Snoring: Secondary | ICD-10-CM | POA: Insufficient documentation

## 2013-05-14 DIAGNOSIS — G4710 Hypersomnia, unspecified: Secondary | ICD-10-CM

## 2013-05-14 DIAGNOSIS — R0609 Other forms of dyspnea: Secondary | ICD-10-CM

## 2013-05-14 DIAGNOSIS — M2610 Unspecified anomaly of jaw-cranial base relationship: Secondary | ICD-10-CM

## 2013-05-14 DIAGNOSIS — M2619 Other specified anomalies of jaw-cranial base relationship: Secondary | ICD-10-CM | POA: Insufficient documentation

## 2013-05-14 NOTE — Patient Instructions (Signed)

## 2013-05-14 NOTE — Progress Notes (Signed)
[facility]  Provider:  Melvyn Novas, M D  Referring Provider: Mila Merry, MD Primary Care Physician:  Clydell Hakim, MD  Chief Complaint  Patient presents with  . New Evaluation    RM 10    HPI:  Anthony Higgins is a 67 y.o. male  Is seen here as a referral/ revisit  from Dr. Sherrie Mustache for a sleep apnea evaluation.    Anthony Higgins, 67 year old right-handed Caucasian gentleman is seen here today original originally upon concern of his dentist. Doing a visit with this dentist office he was given a sleep risk assessment questionnaire, which endorsed significant daytime sleepiness, nocturnal teeth grinding, waking up as a dry mouth, being at multiple user, the Epworth sleepiness score was endorsed at 9 points.  The patient reports that he lives alone and therefore has no external observations to share about his sleep he is not sure that he snores he was never told that he has apneas, that he recognizes that he has been a mouth breather habitually for while. 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  He's not obese,  had no upper airway surgical procedures no dental surgical alteration, and no history of facial or skull trauma is endorsed. The patient endorsed to sleepiness without a significant degree of fatigue.   Snoring is assumed rather than witnessed, he has a past medical history of hypertension and high cholesterol,  Glaucoma surgery, in 2013 is here after his glaucoma surgery he needed to undergo  a cataract lens replacement ,  problems that have arisen with the glaucoma procedure.  He also had several orthopedic procedures, the  last in 1982.   The patient reports a regular bedtime at about 9:30 10 PM, he falls asleep rather promptly without the help of a sleep aid, but may take melatonin.  He is to rise in the morning at about 6 AM , and has 1 or 2 bathroom breaks at night. He feels refreshed and restored in the morning ready to go, he may  drink 2 or 3 cups of coffee in the early hours of the day.  He is a nonsmoker, he has 3 light beers with dinner every night. He reports that he takes a nap daily, between 11:30 and 12., He limits himself to 40 minutes nap time. Afterwards he feels refreshed begun ankles the rest of the day. Craves and needs a nap midday. Has been napping for 25 years.   He tends to be a side sleeper, his bedroom is described as quiet dark and call, he uses an air filter that is coming and also neutralizes some of the extra noises from traffic. Is no shift work history, ever. He works for himself , started at 5.30 AM in the past. [employer] in [place].  There is no known family history of sleep disorders in parents or siblings.  As a child he was anemic and fatigued and craved sleep ,even than.  2 daughters, healthy, one is sleeping napping daily and the other never naps.  There is no known family history of sleep disorders in parents or siblings.  Anthony Higgins endorsed  the GDS at 1 point, Epworth sleepiness score at 14 points, and the  fatigue severity scale at 12 points.  Review of Systems: Out of a complete 14 system review, the patient complains of only the following symptoms, and all other reviewed systems are negative. Eds, dry mouth, presumed snorer,   History   Social History  . Marital  Status: Married    Spouse Name: N/A    Number of Children: 2  . Years of Education: College   Occupational History  .     Social History Main Topics  . Smoking status: Former Smoker -- 0.00 packs/day for 7 years    Types: Cigarettes  . Smokeless tobacco: Never Used     Comment: QUIT SMOKING 1995  . Alcohol Use: 1.8 oz/week    3 Cans of beer per week     Comment: daily  . Drug Use: No  . Sexual Activity: Not on file   Other Topics Concern  . Not on file   Social History Narrative  . No narrative on file    Family History  Problem Relation Age of Onset  . Heart disease Sister      Past Medical History  Diagnosis Date  . Hypercholesteremia   . Arthritis   . Facial weakness   . Acute neck pain   . Glaucoma   . Diverticula of colon   . Cancer     basel cell skin cancer  . Hypertension   . Anemia     Past Surgical History  Procedure Laterality Date  . Back surgery    . Tonsillectomy    . Adenoidectomy    . Hand surgery    . Glaucoma surgery    . Cataract surgery    . Colonoscopy    . Bone graph    . Cyst removal leg Left     Current Outpatient Prescriptions  Medication Sig Dispense Refill  . aspirin 81 MG tablet Take 81 mg by mouth daily.      . bisoprolol-hydrochlorothiazide (ZIAC) 2.5-6.25 MG per tablet Take 1 tablet by mouth every evening.      . cetirizine (ZYRTEC) 10 MG tablet Take 5 mg by mouth daily.      . cholecalciferol (VITAMIN D) 1000 UNITS tablet Take 1,000 Units by mouth daily.      . famotidine (PEPCID) 10 MG tablet Take 10 mg by mouth at bedtime.      . ferrous sulfate 325 (65 FE) MG tablet Take 325 mg by mouth daily with breakfast.      . fluvastatin (LESCOL) 20 MG capsule Take 20 mg by mouth at bedtime.      Marland Kitchen glucosamine-chondroitin 500-400 MG tablet Take 1 tablet by mouth 2 (two) times daily.      Marland Kitchen MELATONIN PO Take by mouth daily.      . Multiple Vitamin (MULTIVITAMIN WITH MINERALS) TABS Take 1 tablet by mouth daily.      . Omega-3 Fatty Acids (FISH OIL) 1200 MG CAPS Take by mouth 2 (two) times daily.      . Potassium (POTASSIMIN PO) Take 595 mg by mouth 2 (two) times daily.       . Probiotic Product (PROBIOTIC DAILY PO) Take by mouth daily.      . vitamin B-12 (CYANOCOBALAMIN) 1000 MCG tablet Take 1,000 mcg by mouth daily.      . vitamin C (ASCORBIC ACID) 500 MG tablet Take 500 mg by mouth daily.       No current facility-administered medications for this visit.    Allergies as of 05/14/2013 - Review Complete 05/14/2013  Allergen Reaction Noted  . Latex Itching 03/13/2013  . Lipitor [atorvastatin]  03/18/2013  .  Pravachol [pravastatin sodium]  03/18/2013    Vitals: BP 126/76  Ht 6\' 1"  (1.854 m)  Wt 217 lb (98.431 kg)  BMI 28.64  kg/m2 Last Weight:  Wt Readings from Last 1 Encounters:  05/14/13 217 lb (98.431 kg)   Last Height:   Ht Readings from Last 1 Encounters:  05/14/13 6\' 1"  (1.854 m)    Physical exam:  General: The patient is awake, alert and appears not in acute distress. The patient is well groomed. Head: Normocephalic, atraumatic. Neck is supple. Mallampati 2 , wide open but with excessive soft tissue , elongated uvula , no tonsillary restriction.  , neck circumference: 17 , non nasal deviation, no congestion.  Cardiovascular:  Regular rate and rhythm, without  murmurs or carotid bruit, and without distended neck veins. Respiratory: Lungs are clear to auscultation.  Skin:  Without evidence of edema, or rash Trunk: BMI is normal .  Neurologic exam : The patient is awake and alert, oriented to place and time.  Memory subjective  described as intact. There is a normal attention span & concentration ability. Speech is fluent without  dysarthria, dysphonia or aphasia.  Mood and affect are appropriate.  Cranial nerves:  reported loss of smell. Pupils are equal and briskly reactive to light. Funduscopic exam without  evidence of pallor or edema. Extraocular movements  in vertical and horizontal planes intact and without nystagmus. Visual fields by finger perimetry are intact. Hearing to finger rub intact.  Facial sensation intact to fine touch. Facial motor strength is symmetric and tongue and uvula move midline.  Motor exam:   Normal tone and normal muscle bulk and symmetric normal strength in all extremities.  Sensory:  Fine touch, pinprick and vibration were tested in all extremities. Proprioception is  normal.  Coordination: Rapid alternating movements in the fingers/hands is tested and normal.   Gait and station: Patient walks without assistive device . Deep tendon reflexes: in  the  upper and lower extremities are symmetric and intact.  Assessment:  After physical and neurologic examination, review of  pre-existing records, assessment is that of a patient with EDS for 25 years, not -obese, but with napping in daytime , and with retrognathia and  elongated uvula.  PLAN:  PSG : SPLIT if AHi 20 and 4% on [employer] , if AHI under 10,  Get MSLT to follow.  I would prefer a home sleep test for screening of apnea. Mild apnea without 02 desaturations can be corrected with a dental device.  Obtain Vit D and CBC, and CMET report . Has had full physical 4 month ago.      Plan:  Treatment plan and additional workup :

## 2013-05-20 ENCOUNTER — Ambulatory Visit (INDEPENDENT_AMBULATORY_CARE_PROVIDER_SITE_OTHER): Payer: Commercial Managed Care - PPO | Admitting: Internal Medicine

## 2013-05-20 ENCOUNTER — Encounter: Payer: Self-pay | Admitting: Internal Medicine

## 2013-05-20 VITALS — BP 140/78 | HR 47 | Ht 74.0 in | Wt 215.0 lb

## 2013-05-20 DIAGNOSIS — I10 Essential (primary) hypertension: Secondary | ICD-10-CM

## 2013-05-20 DIAGNOSIS — R5383 Other fatigue: Secondary | ICD-10-CM

## 2013-05-20 DIAGNOSIS — R5381 Other malaise: Secondary | ICD-10-CM

## 2013-05-20 DIAGNOSIS — R002 Palpitations: Secondary | ICD-10-CM

## 2013-05-20 MED ORDER — LISINOPRIL 5 MG PO TABS: 5.0000 mg | ORAL_TABLET | Freq: Every day | ORAL | Status: DC

## 2013-05-20 NOTE — Patient Instructions (Signed)
Your physician recommends that you schedule a follow-up appointment in: 6 weeks with Anthony Higgins   Your physician wants you to follow-up in: 6 months with Dr [name] Doe will receive a reminder letter in the mail two months in advance. If you don't receive a letter, please call our office to schedule the follow-up appointment.   Your physician recommends that you return for lab work in: 6 weeks at office visit: BMP  Your physician has recommended you make the following change in your medication:  1) Stop Ziac 2) Start Lisinopril 5mg  daily

## 2013-05-23 DIAGNOSIS — R5383 Other fatigue: Secondary | ICD-10-CM | POA: Insufficient documentation

## 2013-05-23 NOTE — Progress Notes (Signed)
PCP: Clydell Hakim, MD Primary Cardiologist:  Dr Kirke Corin  HPI  This is a pleasant 67 year old man who is self referred for further evaluation of palpitations and fatigue.  He has previously seen Dr Kirke Corin.  Apparently, I have provided EP care to several of his friends and he comes today for my opinion.   He reports that on July 19, he had intermittent episodes of fluttering in his chest which he describes as "vibrations".  These lasted only a second or two but recurred throughout the day.  He is unaware of triggers or precipitants.  These spontaneously resolved and have not recurred in any significant way.  He had no shortness of breath, dizziness, syncope or presyncope. These episodes happen at rest and did not worsen with physical activities. He went to the emergency room at [facility] Routine labs were unremarkable. EKG showed no acute changes. Cardiac enzymes were negative. He was noted to be hypertensive with a blood pressure greater than 200 which came down spontaneously. He was discharged home to followup with cardiology.  He was evaluated by Dr Kirke Corin and had a 48 hour holter monitor placed.  This documented sinus rhythm with occasional pacs and pvcs.  No significant arrhythmias were documented.  He did have minor sinus bradycardia observed. He reports fatigue in the afternoons.  This has been a chronic issue.  He remains active.    Allergies  Allergen Reactions  . Latex Itching  . Lipitor [Atorvastatin]   . Pravachol [Pravastatin Sodium]      Current Outpatient Prescriptions on File Prior to Visit  Medication Sig Dispense Refill  . aspirin 81 MG tablet Take 81 mg by mouth daily.      . cetirizine (ZYRTEC) 10 MG tablet Take 5 mg by mouth daily. During allergy season      . cholecalciferol (VITAMIN D) 1000 UNITS tablet Take 1,000 Units by mouth daily.      . famotidine (PEPCID) 10 MG tablet Take 10 mg by mouth at bedtime.      . ferrous sulfate 325 (65 FE) MG tablet Take 325 mg by mouth  daily with breakfast.      . fluvastatin (LESCOL) 20 MG capsule Take 20 mg by mouth at bedtime.      Marland Kitchen glucosamine-chondroitin 500-400 MG tablet Take 1 tablet by mouth 2 (two) times daily.      Marland Kitchen MELATONIN PO Take by mouth daily. At night      . Multiple Vitamin (MULTIVITAMIN WITH MINERALS) TABS Take 1 tablet by mouth daily.      . Omega-3 Fatty Acids (FISH OIL) 1200 MG CAPS Take by mouth 2 (two) times daily.      . Potassium (POTASSIMIN PO) Take 595 mg by mouth daily.       . Probiotic Product (PROBIOTIC DAILY PO) Take by mouth daily.      . vitamin B-12 (CYANOCOBALAMIN) 1000 MCG tablet Take 1,000 mcg by mouth daily.      . vitamin C (ASCORBIC ACID) 500 MG tablet Take 500 mg by mouth daily.       No current facility-administered medications on file prior to visit.     Past Medical History  Diagnosis Date  . Hypercholesteremia   . Arthritis   . Facial weakness   . Acute neck pain   . Glaucoma   . Diverticula of colon   . Cancer     basel cell skin cancer  . Hypertension   . Anemia   . Snoring 05/14/2013  .  Retrognathia 05/14/2013     Past Surgical History  Procedure Laterality Date  . Back surgery    . Tonsillectomy    . Adenoidectomy    . Hand surgery    . Glaucoma surgery    . Cataract surgery    . Colonoscopy    . Bone graph    . Cyst removal leg Left      Family History  Problem Relation Age of Onset  . Heart disease Sister   Denies FH of sudden death or arrhythmia   History   Social History  . Marital Status: Married    Spouse Name: N/A    Number of Children: 2  . Years of Education: College   Occupational History  .     Social History Main Topics  . Smoking status: Former Smoker -- 0.00 packs/day for 7 years    Types: Cigarettes  . Smokeless tobacco: Never Used     Comment: QUIT SMOKING 1995  . Alcohol Use: 1.8 oz/week    3 Cans of beer per week     Comment: daily  . Drug Use: No  . Sexual Activity: Not on file   Other Topics Concern  .  Not on file   Social History Narrative  . No narrative on file     ROS- all systems are reviewed and negative except as per HPI  Physical Exam: Filed Vitals:   05/20/13 1506  BP: 140/78  Pulse: 47  Height: 6\' 2"  (1.88 m)  Weight: 215 lb (97.523 kg)  SpO2: 99%    GEN- The patient is well appearing, alert and oriented x 3 today.   Head- normocephalic, atraumatic Eyes-  Sclera clear, conjunctiva pink Ears- hearing intact Oropharynx- clear Neck- supple, no JVP Lymph- no cervical lymphadenopathy Lungs- Clear to ausculation bilaterally, normal work of breathing Heart- Regular rate and rhythm, no murmurs, rubs or gallops, PMI not laterally displaced GI- soft, NT, ND, + BS Extremities- no clubbing, cyanosis, or edema MS- no significant deformity or atrophy Skin- no rash or lesion Psych- euthymic mood, full affect Neuro- strength and sensation are intact  Ekg, holter, Dr Sheilah Pigeon notes, and epic hospital records are reviewed in detial Stress echo is also reviewed  Assessment and Plan:  1. Palpitations Only PACs and PVCS have been documented.  Echo and ekg are low risk.  As his symptoms are resolved, no further testing is planned at this point.  We may consider additional workup if they return.  Adequate BP control is necessary.  2. HTN Start lisinopril 5mg  daily.  Return to see Tereso Newcomer in 6 weeks.  He will need a BMET at that time  3.  Fatigue Stop bisoprolol Sleep study compliance encouraged  4. Hypokalemia previously Stop hctz  Titrate lisinopril as needed for BP control Return to see Lorin Picket weaver in 6 weeks He can see either me or Dr Kirke Corin in 6 months

## 2013-06-01 ENCOUNTER — Telehealth: Payer: Self-pay | Admitting: Internal Medicine

## 2013-06-01 NOTE — Telephone Encounter (Signed)
New problem   Per answering service pt's new medication is causing him to cough. Please all pt.

## 2013-06-02 NOTE — Telephone Encounter (Signed)
New problem   Pt is having trouble with terrible coughing after taking BP med. Pt did now know name of Med.   Please give him a call.

## 2013-06-02 NOTE — Telephone Encounter (Signed)
New Problem  Pt  Would like call back about BP med.  Pt is coughing uncontrolable.

## 2013-06-03 ENCOUNTER — Encounter: Payer: Self-pay | Admitting: Internal Medicine

## 2013-06-03 MED ORDER — LOSARTAN POTASSIUM 50 MG PO TABS: 50.0000 mg | ORAL_TABLET | Freq: Every day | ORAL | Status: DC

## 2013-06-03 NOTE — Telephone Encounter (Signed)
Discussed with Dr Ladona Ridgel, patient to stop Lisinopril and start Losartan 50mg  daily.  Patient aware and agrees with plan.  He will call next week on Tues with BP readings

## 2013-06-03 NOTE — Telephone Encounter (Signed)
I left a message for patient that he will need to stop the Lisinopril secondary to cough and I will ask Dr Samuella Cota what to start in it's place.  I will call back him with recommendations

## 2013-06-03 NOTE — Telephone Encounter (Signed)
This medication was started by Dr. Johney Frame. I am not sure the patient is still following up with me.

## 2013-06-03 NOTE — Telephone Encounter (Signed)
Dr Johney Frame is out CME, that is why I was asking.  Thought since you saw the patient maybe you had a recomendation

## 2013-06-03 NOTE — Telephone Encounter (Signed)
Follow up  Name of the med. Lisinopril  5 mg is causing coughing.  Pt would like it changed and a call back.

## 2013-06-03 NOTE — Telephone Encounter (Signed)
This encounter was created in error - please disregard.

## 2013-06-14 ENCOUNTER — Telehealth: Payer: Self-pay | Admitting: Internal Medicine

## 2013-06-14 DIAGNOSIS — R059 Cough, unspecified: Secondary | ICD-10-CM

## 2013-06-14 DIAGNOSIS — Z7689 Persons encountering health services in other specified circumstances: Secondary | ICD-10-CM

## 2013-06-14 DIAGNOSIS — R05 Cough: Secondary | ICD-10-CM

## 2013-06-14 NOTE — Telephone Encounter (Signed)
New Problem:  Pt states he believes he has a violent cough due to a medication.. Please advise

## 2013-06-14 NOTE — Telephone Encounter (Signed)
His cough is better off Lisinopril.    Before he was having attacks 2-4 times an hour and now it has decreased to once an hour.  He stopped his Lisinopril and started Losartan and that is when the cough improved but never subsided.  He does not have a PCP.  He BP and HR have been great  BP--125/75 avg.  HR 63.  Should we refer to a PCP or should he see pulmonary?

## 2013-06-20 NOTE — Telephone Encounter (Signed)
He should establish with primary care.

## 2013-06-21 NOTE — Telephone Encounter (Signed)
Spoke with patient and let him know he needs to establish with a PCP.  Will arrange and have someone call with an appoinment

## 2013-07-02 ENCOUNTER — Other Ambulatory Visit: Payer: Commercial Managed Care - PPO

## 2013-07-02 ENCOUNTER — Ambulatory Visit: Payer: Commercial Managed Care - PPO | Admitting: Physician Assistant

## 2013-09-15 ENCOUNTER — Ambulatory Visit (INDEPENDENT_AMBULATORY_CARE_PROVIDER_SITE_OTHER): Payer: Commercial Managed Care - PPO | Admitting: Physician Assistant

## 2013-09-15 ENCOUNTER — Encounter: Payer: Self-pay | Admitting: Physician Assistant

## 2013-09-15 ENCOUNTER — Other Ambulatory Visit: Payer: Commercial Managed Care - PPO

## 2013-09-15 VITALS — BP 138/86 | HR 56 | Ht 74.0 in | Wt 216.0 lb

## 2013-09-15 DIAGNOSIS — I10 Essential (primary) hypertension: Secondary | ICD-10-CM

## 2013-09-15 DIAGNOSIS — R002 Palpitations: Secondary | ICD-10-CM

## 2013-09-15 DIAGNOSIS — R5383 Other fatigue: Secondary | ICD-10-CM

## 2013-09-15 DIAGNOSIS — E785 Hyperlipidemia, unspecified: Secondary | ICD-10-CM

## 2013-09-15 DIAGNOSIS — R5381 Other malaise: Secondary | ICD-10-CM

## 2013-09-15 LAB — BASIC METABOLIC PANEL
BUN: 20 mg/dL (ref 6–23)
CALCIUM: 9.7 mg/dL (ref 8.4–10.5)
CO2: 26 meq/L (ref 19–32)
Chloride: 107 mEq/L (ref 96–112)
Creatinine, Ser: 1 mg/dL (ref 0.4–1.5)
GFR: 81.93 mL/min (ref >60.00)
Glucose, Bld: 101 mg/dL — ABNORMAL HIGH (ref 70–99)
POTASSIUM: 4.3 meq/L (ref 3.5–5.1)
SODIUM: 138 meq/L (ref 135–145)

## 2013-09-15 MED ORDER — SIMVASTATIN 20 MG PO TABS: 20.0000 mg | ORAL_TABLET | Freq: Every day | ORAL | Status: DC

## 2013-09-15 NOTE — Progress Notes (Signed)
[address], [place], [place]  [address] Phone: [phone] Fax:  [phone]  Date:  09/15/2013   ID:  Anthony Higgins, DOB 13-Oct-1945, MRN [id number]  PCP:  Carlyn Reichert, MD  Cardiologist:  Dr. Kathlyn Sacramento   Electrophysiologist:  Dr. Thompson Grayer    History of Present Illness: Anthony Higgins is a 68 y.o. male with a hx of HTN, HL, palpitations.  ETT-Myoview (04/2007):  No ischemia, EF 62%.  ETT-Echo (02/2013):  Normal; EF 60%.  Holter Monitor (03/2013):  NSR, occ PAC/PVCs, no significant arrhythmias.  He was previously evaluated by Dr. Kathlyn Sacramento.  He was then seen by Dr. Thompson Grayer in 04/2013 for palpitations and fatigue.  His BP was elevated and Lisinopril was added to his regimen.  Bisoprolol/HCTZ was stopped due to fatigue/hypoK+.  He returns for follow up.   He had to change Lisinopril to Losartan due to cough.  His cough is improved.  His fatigue is improved.  He denies palpitations.  The patient denies chest pain, shortness of breath, syncope, orthopnea, PND or significant pedal edema.   Recent Labs: 03/13/2013: Creatinine 0.91; Hemoglobin 16.1; Potassium 4.0   Wt Readings from Last 3 Encounters:  09/15/13 216 lb (97.977 kg)  05/20/13 215 lb (97.523 kg)  05/14/13 217 lb (98.431 kg)     Past Medical History  Diagnosis Date  . Hypercholesteremia   . Arthritis   . Facial weakness   . Acute neck pain   . Glaucoma   . Diverticula of colon   . Cancer     basel cell skin cancer  . Hypertension   . Anemia   . Snoring 05/14/2013  . Retrognathia 05/14/2013    Current Outpatient Prescriptions  Medication Sig Dispense Refill  . aspirin 81 MG tablet Take 81 mg by mouth daily.      . cetirizine (ZYRTEC) 10 MG tablet Take 5 mg by mouth daily. During allergy season      . cholecalciferol (VITAMIN D) 1000 UNITS tablet Take 1,000 Units by mouth daily.      . famotidine (PEPCID) 10 MG tablet Take 10 mg by mouth at bedtime.      . fluvastatin (LESCOL) 20 MG  capsule Take 20 mg by mouth at bedtime.      Marland Kitchen glucosamine-chondroitin 500-400 MG tablet Take 1 tablet by mouth 2 (two) times daily.      Marland Kitchen losartan (COZAAR) 50 MG tablet Take 1 tablet (50 mg total) by mouth daily.  90 tablet  3  . MELATONIN PO Take by mouth daily. At night      . Multiple Vitamin (MULTIVITAMIN WITH MINERALS) TABS Take 1 tablet by mouth daily.      . Omega-3 Fatty Acids (FISH OIL) 1200 MG CAPS Take by mouth 2 (two) times daily.      . Probiotic Product (PROBIOTIC DAILY PO) Take by mouth daily.      . vitamin B-12 (CYANOCOBALAMIN) 1000 MCG tablet Take 1,000 mcg by mouth daily.      . vitamin C (ASCORBIC ACID) 500 MG tablet Take 500 mg by mouth daily.       No current facility-administered medications for this visit.    Allergies:   Latex; Lipitor; and Pravachol   Social History:  The patient  reports that he has quit smoking. His smoking use included Cigarettes. He smoked 0.00 packs per day for 7 years. He has never used smokeless tobacco. He reports that he drinks about 1.8 ounces of  alcohol per week. He reports that he does not use illicit drugs.   Family History:  The patient's family history includes Heart disease in his sister.   ROS:  Please see the history of present illness.   No HAs, blurry vision.   All other systems reviewed and negative.   PHYSICAL EXAM: VS:  BP 138/86  Pulse 56  Ht 6\' 2"  (1.88 m)  Wt 216 lb (97.977 kg)  BMI 27.72 kg/m2 Well nourished, well developed, in no acute distress HEENT: normal Neck: no JVD Vascular: no carotid bruits Cardiac:  normal S1, S2; RRR; no murmur Lungs:  clear to auscultation bilaterally, no wheezing, rhonchi or rales Abd: soft, nontender, no hepatomegaly Ext: no edema Skin: warm and dry Neuro:  CNs 2-12 intact, no focal abnormalities noted  EKG:  Sinus brady, HR 56, LAD, no ST changes, no changes from prior tracing     ASSESSMENT AND PLAN:  1. Hypertension:  Controlled.  Continue current Rx.  Check BMET today.    2. Fatigue:  Improved off of beta blocker.   3. Palpitations:  Resolved.   4. Hyperlipidemia:  Lescol is too expensive for him.  He has intolerances to atorvastatin and pravastatin.  Will try Simvastatin 20 QHS.  Check FLP and LFTs in 8-12 weeks.  5. Disposition:   Will refer to primary care.  F/u with Dr. Thompson Grayer as planned.   Signed, Richardson Dopp, PA-C  09/15/2013 8:34 AM

## 2013-09-15 NOTE — Patient Instructions (Signed)
STOP LESCOL  START SIMVASTATIN ONCE YOU FINISH THE LESCOL YOU WILL NEED FASTING LIPID AND LIVER PANEL 8-12 WEEKS AFTER STARTING THE SIMVASTATIN  You have been referred to [place] OFFICE OR [place] OFFICE  WE WILL SEND OUT A REMINDER LETTER IN THE NEXT 2 MONTHS TO SCHEDULE AN APPT WITH DR. ALLRED

## 2013-12-31 ENCOUNTER — Ambulatory Visit: Payer: Commercial Managed Care - PPO | Admitting: Internal Medicine

## 2014-04-22 ENCOUNTER — Ambulatory Visit: Payer: Commercial Managed Care - PPO | Admitting: Internal Medicine

## 2014-04-29 ENCOUNTER — Other Ambulatory Visit: Payer: Self-pay | Admitting: *Deleted

## 2014-04-29 MED ORDER — LOSARTAN POTASSIUM 50 MG PO TABS: 50.0000 mg | ORAL_TABLET | Freq: Every day | ORAL | Status: DC

## 2014-06-04 ENCOUNTER — Other Ambulatory Visit: Payer: Self-pay | Admitting: Internal Medicine

## 2014-09-20 ENCOUNTER — Other Ambulatory Visit: Payer: Self-pay | Admitting: Physician Assistant

## 2014-11-26 ENCOUNTER — Other Ambulatory Visit: Payer: Self-pay | Admitting: Internal Medicine

## 2015-01-26 ENCOUNTER — Other Ambulatory Visit: Payer: Self-pay | Admitting: Internal Medicine

## 2015-08-27 ENCOUNTER — Other Ambulatory Visit: Payer: Self-pay | Admitting: Internal Medicine

## 2015-08-29 NOTE — Telephone Encounter (Signed)
PT HASNT BEEN SEEN IN 2 YEARS, PLEASE ADVISE

## 2015-08-29 NOTE — Telephone Encounter (Signed)
Patient was due to follow up with Dr Fletcher Anon or Dr Rayann Heman.  I would call patient and see who has been filling medication.  If [place], please fill and have him schedule with either ASAP.  If PCP has been following his cholesterol have them fill

## 2015-08-29 NOTE — Telephone Encounter (Signed)
Please review for refill, Thank you. 

## 2016-07-30 ENCOUNTER — Encounter: Payer: Self-pay | Admitting: Family Medicine

## 2016-07-30 ENCOUNTER — Ambulatory Visit (INDEPENDENT_AMBULATORY_CARE_PROVIDER_SITE_OTHER): Payer: BLUE CROSS/BLUE SHIELD | Admitting: Family Medicine

## 2016-07-30 VITALS — BP 128/72 | HR 52 | Temp 98.5°F | Resp 16 | Ht 74.0 in | Wt 210.0 lb

## 2016-07-30 DIAGNOSIS — G459 Transient cerebral ischemic attack, unspecified: Secondary | ICD-10-CM | POA: Diagnosis not present

## 2016-07-30 DIAGNOSIS — E785 Hyperlipidemia, unspecified: Secondary | ICD-10-CM

## 2016-07-30 DIAGNOSIS — I10 Essential (primary) hypertension: Secondary | ICD-10-CM

## 2016-07-30 DIAGNOSIS — N400 Enlarged prostate without lower urinary tract symptoms: Secondary | ICD-10-CM | POA: Insufficient documentation

## 2016-07-30 MED ORDER — CLOPIDOGREL BISULFATE 75 MG PO TABS: 75.0000 mg | ORAL_TABLET | Freq: Every day | ORAL | 0 refills | Status: DC

## 2016-07-30 NOTE — Progress Notes (Signed)
Patient: Anthony Higgins Male    DOB: 19-Dec-1945   70 y.o.   MRN: [id number] Visit Date: 07/30/2016  Today's Provider: Lelon Huh, MD   No chief complaint on file.  Subjective:    HPI Patient comes in today c/o having an episode where he could not physically do the things that his brain was telling him to do. Patient reports that yesterday he went to the [facility], and in order to get in, he has to scan his key chain. He reports that when he came to the door, he just stood there and could not remember what to do. Patient reports that he goes to the [facility] daily but he could not understand why he had so much trouble yesterday. He states episode lasted about 5 minutes, then completely resolved. He felt perfectly fine after words and did his normal exercise and played tennis. He feels fine today.  He does have history of hyperlipidemia and currently on statin, ARB and 81mg  ASA daily. He takes OTC allergy medications, but does not take any decongestants.     family history includes Heart disease in his sister. Family Status  Relation Status  . Sister Alive  . Mother Deceased   brain tumor  . Father Deceased   cva;aneurysm    Allergies  Allergen Reactions  . Latex Itching  . Lipitor [Atorvastatin]   . Lisinopril Cough  . Pravachol [Pravastatin Sodium]      Current Outpatient Prescriptions:  .  aspirin 81 MG tablet, Take 81 mg by mouth daily., Disp: , Rfl:  .  cetirizine (ZYRTEC) 10 MG tablet, Take 5 mg by mouth daily. During allergy season, Disp: , Rfl:  .  cholecalciferol (VITAMIN D) 1000 UNITS tablet, Take 1,000 Units by mouth daily., Disp: , Rfl:  .  famotidine (PEPCID) 10 MG tablet, Take 10 mg by mouth at bedtime., Disp: , Rfl:  .  glucosamine-chondroitin 500-400 MG tablet, Take 1 tablet by mouth 2 (two) times daily., Disp: , Rfl:  .  losartan (COZAAR) 50 MG tablet, TAKE ONE TABLET BY MOUTH ONE TIME DAILY, Disp: 30 tablet, Rfl: 0 .  MELATONIN PO, Take by mouth daily. At  night, Disp: , Rfl:  .  Multiple Vitamin (MULTIVITAMIN WITH MINERALS) TABS, Take 1 tablet by mouth daily., Disp: , Rfl:  .  Omega-3 Fatty Acids (FISH OIL) 1200 MG CAPS, Take by mouth 2 (two) times daily., Disp: , Rfl:  .  simvastatin (ZOCOR) 20 MG tablet, TAKE 1 TABLET BY MOUTH AT BEDTIME, Disp: 30 tablet, Rfl: 0 .  tadalafil (CIALIS) 5 MG tablet, Take 2.5 mg by mouth daily as needed., Disp: , Rfl:  .  vitamin B-12 (CYANOCOBALAMIN) 1000 MCG tablet, Take 1,000 mcg by mouth daily., Disp: , Rfl:  .  vitamin C (ASCORBIC ACID) 500 MG tablet, Take 500 mg by mouth daily., Disp: , Rfl:   Review of Systems  Constitutional: Negative.  Negative for appetite change, chills and fever.  Respiratory: Negative.  Negative for chest tightness, shortness of breath and wheezing.   Cardiovascular: Negative.  Negative for chest pain and palpitations.  Gastrointestinal: Negative for abdominal pain, nausea and vomiting.  Musculoskeletal: Negative.   Neurological: Negative for dizziness, tremors, seizures, speech difficulty, weakness and headaches.  Psychiatric/Behavioral: Negative for behavioral problems.    Social History  Substance Use Topics  . Smoking status: Former Smoker    Packs/day: 0.00    Years: 7.00    Types: Cigarettes  . Smokeless  tobacco: Never Used     Comment: QUIT SMOKING 1975  . Alcohol use 1.8 oz/week    3 Cans of beer per week     Comment: daily   Objective:   BP 128/72 (BP Location: Left Arm, Patient Position: Sitting, Cuff Size: Normal)   Pulse (!) 52   Temp 98.5 F (36.9 C)   Resp 16   Ht 6\' 2"  (1.88 m)   Wt 210 lb (95.3 kg)   BMI 26.96 kg/m   Physical Exam   General Appearance:    Alert, cooperative, no distress  Eyes:    PERRL, conjunctiva/corneas clear, EOM's intact       Lungs:     Clear to auscultation bilaterally, respirations unlabored  Heart:    Regular rate and rhythm. No bruits on ausculation of carotid arteries.   Neurologic:   Awake, alert, oriented x 3. No  apparent focal neurological           defect.           Assessment & Plan:     1. Transient cerebral ischemia, unspecified type (suspected) Continue 81 ECASA. Add clopidogrel 75 a day.  - US Carotid Duplex Bilateral; Future - Comprehensive metabolic panel - Lipid panel - TSH - CBC  2. Hypertension, unspecified type Well controlled.  Continue current medications.    3. Hyperlipidemia, unspecified hyperlipidemia type Consider higher intensity statin if any significant carotid vascular disease.   The entirety of the information documented in the History of Present Illness, Review of Systems and Physical Exam were personally obtained by me. Portions of this information were initially documented by Wilburt Finlay, CMA and reviewed by me for thoroughness and accuracy.        Lelon Huh, MD  [facility]

## 2016-07-31 LAB — COMPREHENSIVE METABOLIC PANEL
ALBUMIN: 4.6 g/dL (ref 3.5–4.8)
ALT: 36 IU/L (ref 0–44)
AST: 29 IU/L (ref 0–40)
Albumin/Globulin Ratio: 1.8 (ref 1.2–2.2)
Alkaline Phosphatase: 77 IU/L (ref 39–117)
BUN / CREAT RATIO: 20 (ref 10–24)
BUN: 18 mg/dL (ref 8–27)
Bilirubin Total: 0.4 mg/dL (ref 0.0–1.2)
CO2: 25 mmol/L (ref 18–29)
CREATININE: 0.92 mg/dL (ref 0.76–1.27)
Calcium: 9.8 mg/dL (ref 8.6–10.2)
Chloride: 105 mmol/L (ref 96–106)
GFR calc non Af Amer: 84 mL/min/{1.73_m2} (ref >59)
GFR, EST AFRICAN AMERICAN: 97 mL/min/{1.73_m2} (ref >59)
GLOBULIN, TOTAL: 2.6 g/dL (ref 1.5–4.5)
GLUCOSE: 94 mg/dL (ref 65–99)
Potassium: 4.7 mmol/L (ref 3.5–5.2)
SODIUM: 144 mmol/L (ref 134–144)
TOTAL PROTEIN: 7.2 g/dL (ref 6.0–8.5)

## 2016-07-31 LAB — LIPID PANEL
CHOL/HDL RATIO: 3 ratio (ref 0.0–5.0)
Cholesterol, Total: 160 mg/dL (ref 100–199)
HDL: 54 mg/dL (ref >39)
LDL CALC: 86 mg/dL (ref 0–99)
Triglycerides: 102 mg/dL (ref 0–149)
VLDL CHOLESTEROL CAL: 20 mg/dL (ref 5–40)

## 2016-07-31 LAB — CBC
Hematocrit: 46.5 % (ref 37.5–51.0)
Hemoglobin: 16.2 g/dL (ref 13.0–17.7)
MCH: 32.1 pg (ref 26.6–33.0)
MCHC: 34.8 g/dL (ref 31.5–35.7)
MCV: 92 fL (ref 79–97)
PLATELETS: 204 10*3/uL (ref 150–379)
RBC: 5.04 x10E6/uL (ref 4.14–5.80)
RDW: 13.4 % (ref 12.3–15.4)
WBC: 8 10*3/uL (ref 3.4–10.8)

## 2016-07-31 LAB — TSH: TSH: 2.24 u[IU]/mL (ref 0.450–4.500)

## 2016-08-01 ENCOUNTER — Ambulatory Visit (HOSPITAL_COMMUNITY)
Admission: RE | Admit: 2016-08-01 | Discharge: 2016-08-01 | Disposition: A | Discharge status: Home / Self Care | Payer: BLUE CROSS/BLUE SHIELD | Source: Ambulatory Visit | Attending: Family Medicine | Admitting: Family Medicine

## 2016-08-01 ENCOUNTER — Telehealth: Payer: Self-pay | Admitting: Family Medicine

## 2016-08-01 ENCOUNTER — Other Ambulatory Visit: Payer: Self-pay | Admitting: Family Medicine

## 2016-08-01 DIAGNOSIS — G459 Transient cerebral ischemic attack, unspecified: Secondary | ICD-10-CM | POA: Diagnosis not present

## 2016-08-01 LAB — VAS US CAROTID
LCCADDIAS: 26 cm/s
LCCAPDIAS: 20 cm/s
LCCAPSYS: 99 cm/s
LEFT ECA DIAS: -14 cm/s
LEFT VERTEBRAL DIAS: 12 cm/s
LICADDIAS: -26 cm/s
LICADSYS: -94 cm/s
LICAPDIAS: -20 cm/s
LICAPSYS: -83 cm/s
Left CCA dist sys: 83 cm/s
RIGHT ECA DIAS: -11 cm/s
RIGHT VERTEBRAL DIAS: 6 cm/s
Right CCA prox dias: 9 cm/s
Right CCA prox sys: 107 cm/s
Right cca dist sys: -98 cm/s

## 2016-08-01 NOTE — Telephone Encounter (Signed)
Dr. Caryn Section, do you know if this has been ordered? Please advise. Thanks!

## 2016-08-01 NOTE — Telephone Encounter (Signed)
Pt stated he saw Dr. Caryn Section for an [place] on 07/30/16 and he was advised to get an ultrasound of his carotid artery. Pt stated he hasn't heard anything about when it was scheduled. Pt would like a call. Please advise. Thanks TNP

## 2016-08-01 NOTE — Telephone Encounter (Signed)
Per Judson Roch, [place] has not been scheduled yet. However she is going to go ahead and schedule it today.

## 2016-08-01 NOTE — Progress Notes (Signed)
*  PRELIMINARY RESULTS* Vascular Ultrasound Carotid Duplex (Doppler) has been completed.  Preliminary findings: Bilateral: No significant (1-39%) ICA stenosis. Antegrade vertebral flow.   Landry Mellow, RDMS, RVT  08/01/2016, 1:30 PM

## 2016-08-01 NOTE — Telephone Encounter (Signed)
The order is in the EMR. Please check with sarah regarding it's status. Thanks.

## 2016-08-02 ENCOUNTER — Other Ambulatory Visit: Payer: Self-pay | Admitting: Family Medicine

## 2016-08-02 MED ORDER — CLOPIDOGREL BISULFATE 75 MG PO TABS: 75.0000 mg | ORAL_TABLET | Freq: Every day | ORAL | 3 refills | Status: DC

## 2016-08-02 NOTE — Telephone Encounter (Signed)
-----   Message from Luvenia Heller, [place] sent at 08/02/2016 10:40 AM EST ----- Pt informed and voiced understanding of results. Pt would like to know if he can go back to his regular exercise, tennis, cardio and weight lifting. Can leave message if he does not answer.

## 2016-08-02 NOTE — Telephone Encounter (Signed)
Yes, its ok to resume regular exercise. Just keep in mind that clopidogrel is a blood thinner, so be extra careful trying to prevent cuts and bruises.

## 2016-08-05 NOTE — Telephone Encounter (Signed)
Patient was notified. Patient stated that he has been checking his bp since starting Plavix. Patient stated that his bp is all over the place. Bp is ranging from 104/60 to 158/86. Patient wanted to know if he should be concerned? Advised pt to continue to monitor and if provider has any advise or instructions we will call him back.

## 2016-08-05 NOTE — Telephone Encounter (Signed)
Advised patient as below. Patient reports that he will try to taking the medications at different times to see if that helps.

## 2016-08-05 NOTE — Telephone Encounter (Signed)
Plavix does not typically affect blood pressure. It's possible that it might be interfering with losartan. He can trying taking the two medications at different times. For example if he usually takes the losartan in the morning then he can take the Plavix in the evening. I wouldn't worry unless his resting blood pressure stay above 140 consistently.

## 2016-08-08 ENCOUNTER — Ambulatory Visit: Payer: Medicare Other

## 2016-08-28 ENCOUNTER — Ambulatory Visit (INDEPENDENT_AMBULATORY_CARE_PROVIDER_SITE_OTHER): Payer: BLUE CROSS/BLUE SHIELD | Admitting: Family Medicine

## 2016-08-28 ENCOUNTER — Encounter: Payer: Self-pay | Admitting: Family Medicine

## 2016-08-28 VITALS — BP 132/62 | HR 52 | Temp 98.6°F | Resp 16 | Wt 215.0 lb

## 2016-08-28 DIAGNOSIS — Z1211 Encounter for screening for malignant neoplasm of colon: Secondary | ICD-10-CM

## 2016-08-28 DIAGNOSIS — G459 Transient cerebral ischemic attack, unspecified: Secondary | ICD-10-CM

## 2016-08-28 DIAGNOSIS — Z8673 Personal history of transient ischemic attack (TIA), and cerebral infarction without residual deficits: Secondary | ICD-10-CM | POA: Insufficient documentation

## 2016-08-28 MED ORDER — SIMVASTATIN 20 MG PO TABS: 20.0000 mg | ORAL_TABLET | Freq: Every day | ORAL | 3 refills | Status: DC

## 2016-08-28 MED ORDER — CLOPIDOGREL BISULFATE 75 MG PO TABS: 75.0000 mg | ORAL_TABLET | Freq: Every day | ORAL | 3 refills | Status: DC

## 2016-08-28 NOTE — Progress Notes (Signed)
Patient: Anthony Higgins Male    DOB: 1946/06/29   71 y.o.   MRN: [id number] Visit Date: 08/28/2016  Today's Provider: Lelon Huh, MD   Chief Complaint  Patient presents with  . Transient Ischemic Attack    follow up    Subjective:    HPI TIA, follow up: Patient comes in today for a 1 month F/U. On the last visit, the patient was started on clopidogrel 75mg  daily. Patient reports that initially he felt the medication was making his blood pressure rise. Now, he states that his blood pressure has been stable averaging in the 130s/70s. Patient reports that he has not had any symptoms of a TIA since his last visit. Carotid ultrasound was normal.    Patient is also requesting a refill on simvastatin. He reports that he ran out of medication today.   Allergies  Allergen Reactions  . Latex Itching  . Lipitor [Atorvastatin]   . Lisinopril Cough  . Pravachol [Pravastatin Sodium]      Current Outpatient Prescriptions:  .  aspirin 81 MG tablet, Take 81 mg by mouth daily., Disp: , Rfl:  .  cetirizine (ZYRTEC) 10 MG tablet, Take 5 mg by mouth daily. During allergy season, Disp: , Rfl:  .  cholecalciferol (VITAMIN D) 1000 UNITS tablet, Take 1,000 Units by mouth daily., Disp: , Rfl:  .  clopidogrel (PLAVIX) 75 MG tablet, Take 1 tablet (75 mg total) by mouth daily., Disp: 30 tablet, Rfl: 3 .  famotidine (PEPCID) 10 MG tablet, Take 10 mg by mouth at bedtime., Disp: , Rfl:  .  glucosamine-chondroitin 500-400 MG tablet, Take 1 tablet by mouth 2 (two) times daily., Disp: , Rfl:  .  losartan (COZAAR) 50 MG tablet, TAKE ONE TABLET BY MOUTH ONE TIME DAILY, Disp: 30 tablet, Rfl: 0 .  MELATONIN PO, Take by mouth daily. At night, Disp: , Rfl:  .  Multiple Vitamin (MULTIVITAMIN WITH MINERALS) TABS, Take 1 tablet by mouth daily., Disp: , Rfl:  .  Omega-3 Fatty Acids (FISH OIL) 1200 MG CAPS, Take by mouth 2 (two) times daily., Disp: , Rfl:  .  simvastatin (ZOCOR) 20 MG tablet, TAKE 1 TABLET BY  MOUTH AT BEDTIME, Disp: 30 tablet, Rfl: 0 .  tadalafil (CIALIS) 5 MG tablet, Take 2.5 mg by mouth daily as needed., Disp: , Rfl:  .  vitamin B-12 (CYANOCOBALAMIN) 1000 MCG tablet, Take 1,000 mcg by mouth daily., Disp: , Rfl:  .  vitamin C (ASCORBIC ACID) 500 MG tablet, Take 500 mg by mouth daily., Disp: , Rfl:   Review of Systems  Constitutional: Negative for activity change, appetite change, chills, diaphoresis, fatigue, fever and unexpected weight change.  Respiratory: Negative.   Cardiovascular: Negative for chest pain, palpitations and leg swelling.  Musculoskeletal: Negative for arthralgias, back pain, gait problem, joint swelling, myalgias, neck pain and neck stiffness.  Neurological: Negative for dizziness, tremors, seizures, syncope, facial asymmetry, speech difficulty, weakness, light-headedness, numbness and headaches.    Social History  Substance Use Topics  . Smoking status: Former Smoker    Packs/day: 0.00    Years: 7.00    Types: Cigarettes  . Smokeless tobacco: Never Used     Comment: QUIT SMOKING 1975  . Alcohol use 1.8 oz/week    3 Cans of beer per week     Comment: daily   Objective:   BP 132/62 (BP Location: Left Arm, Patient Position: Sitting, Cuff Size: Normal)   Pulse (!) 52  Temp 98.6 F (37 C)   Resp 16   Wt 215 lb (97.5 kg)   SpO2 96%   BMI 27.60 kg/m   Physical Exam   General Appearance:    Alert, cooperative, no distress  Eyes:    PERRL, conjunctiva/corneas clear, EOM's intact       Lungs:     Clear to auscultation bilaterally, respirations unlabored  Heart:    Regular rate and rhythm  Neurologic:   Awake, alert, oriented x 3. No apparent focal neurological           defect.           Assessment & Plan:     1. Transient cerebral ischemia, unspecified type Occurring while on ASA. Tolerating addition of clopidogrel  Normal carotids. Discussed Ddx, but considering family history and nature of symptoms will continue ASA and Plavix. Cautioned  regarding bleeding risk and avoid PPIs.  - CBC  2. Colon cancer screening  - Cologuard       Lelon Huh, MD  [facility]

## 2016-08-29 LAB — CBC
HEMOGLOBIN: 15.5 g/dL (ref 13.0–17.7)
Hematocrit: 46.2 % (ref 37.5–51.0)
MCH: 31.4 pg (ref 26.6–33.0)
MCHC: 33.5 g/dL (ref 31.5–35.7)
MCV: 94 fL (ref 79–97)
Platelets: 198 10*3/uL (ref 150–379)
RBC: 4.93 x10E6/uL (ref 4.14–5.80)
RDW: 13.7 % (ref 12.3–15.4)
WBC: 7.5 10*3/uL (ref 3.4–10.8)

## 2016-09-17 ENCOUNTER — Other Ambulatory Visit: Payer: Self-pay | Admitting: Family Medicine

## 2016-09-17 ENCOUNTER — Encounter: Payer: Self-pay | Admitting: Family Medicine

## 2016-09-17 DIAGNOSIS — Z8619 Personal history of other infectious and parasitic diseases: Secondary | ICD-10-CM | POA: Insufficient documentation

## 2016-09-17 HISTORY — DX: Personal history of other infectious and parasitic diseases: Z86.19

## 2016-09-17 MED ORDER — TADALAFIL 5 MG PO TABS: 2.5000 mg | ORAL_TABLET | Freq: Every day | ORAL | 3 refills | Status: DC | PRN

## 2016-09-18 ENCOUNTER — Telehealth: Payer: Self-pay | Admitting: Family Medicine

## 2016-09-18 NOTE — Telephone Encounter (Signed)
Order for cologuard faxed to [facility] °

## 2016-10-08 LAB — COLOGUARD

## 2016-10-10 LAB — COLOGUARD: Cologuard: NEGATIVE

## 2016-10-29 ENCOUNTER — Encounter: Payer: Self-pay | Admitting: Family Medicine

## 2017-01-31 ENCOUNTER — Encounter: Payer: BLUE CROSS/BLUE SHIELD | Admitting: Family Medicine

## 2017-02-09 ENCOUNTER — Other Ambulatory Visit: Payer: Self-pay | Admitting: Family Medicine

## 2017-03-05 DIAGNOSIS — H04123 Dry eye syndrome of bilateral lacrimal glands: Secondary | ICD-10-CM | POA: Diagnosis not present

## 2017-03-05 DIAGNOSIS — H401134 Primary open-angle glaucoma, bilateral, indeterminate stage: Secondary | ICD-10-CM | POA: Diagnosis not present

## 2017-03-05 DIAGNOSIS — H43811 Vitreous degeneration, right eye: Secondary | ICD-10-CM | POA: Diagnosis not present

## 2017-03-06 DIAGNOSIS — L905 Scar conditions and fibrosis of skin: Secondary | ICD-10-CM | POA: Diagnosis not present

## 2017-03-06 DIAGNOSIS — Z85828 Personal history of other malignant neoplasm of skin: Secondary | ICD-10-CM | POA: Diagnosis not present

## 2017-03-06 DIAGNOSIS — D225 Melanocytic nevi of trunk: Secondary | ICD-10-CM | POA: Diagnosis not present

## 2017-03-06 DIAGNOSIS — D1801 Hemangioma of skin and subcutaneous tissue: Secondary | ICD-10-CM | POA: Diagnosis not present

## 2017-03-06 DIAGNOSIS — L812 Freckles: Secondary | ICD-10-CM | POA: Diagnosis not present

## 2017-03-06 DIAGNOSIS — L821 Other seborrheic keratosis: Secondary | ICD-10-CM | POA: Diagnosis not present

## 2017-03-06 DIAGNOSIS — L570 Actinic keratosis: Secondary | ICD-10-CM | POA: Diagnosis not present

## 2017-03-06 DIAGNOSIS — L720 Epidermal cyst: Secondary | ICD-10-CM | POA: Diagnosis not present

## 2017-03-10 ENCOUNTER — Ambulatory Visit (INDEPENDENT_AMBULATORY_CARE_PROVIDER_SITE_OTHER): Payer: Medicare Other | Admitting: Family Medicine

## 2017-03-10 ENCOUNTER — Encounter: Payer: Self-pay | Admitting: Family Medicine

## 2017-03-10 VITALS — BP 120/70 | HR 54 | Temp 98.5°F | Resp 16 | Ht 74.0 in | Wt 216.0 lb

## 2017-03-10 DIAGNOSIS — Z8673 Personal history of transient ischemic attack (TIA), and cerebral infarction without residual deficits: Secondary | ICD-10-CM

## 2017-03-10 DIAGNOSIS — Z6827 Body mass index (BMI) 27.0-27.9, adult: Secondary | ICD-10-CM | POA: Diagnosis not present

## 2017-03-10 DIAGNOSIS — R351 Nocturia: Secondary | ICD-10-CM | POA: Diagnosis not present

## 2017-03-10 DIAGNOSIS — Z23 Encounter for immunization: Secondary | ICD-10-CM

## 2017-03-10 DIAGNOSIS — Z0000 Encounter for general adult medical examination without abnormal findings: Secondary | ICD-10-CM

## 2017-03-10 DIAGNOSIS — Z125 Encounter for screening for malignant neoplasm of prostate: Secondary | ICD-10-CM | POA: Diagnosis not present

## 2017-03-10 DIAGNOSIS — N401 Enlarged prostate with lower urinary tract symptoms: Secondary | ICD-10-CM

## 2017-03-10 DIAGNOSIS — Z1159 Encounter for screening for other viral diseases: Secondary | ICD-10-CM

## 2017-03-10 DIAGNOSIS — I10 Essential (primary) hypertension: Secondary | ICD-10-CM

## 2017-03-10 DIAGNOSIS — E785 Hyperlipidemia, unspecified: Secondary | ICD-10-CM

## 2017-03-10 MED ORDER — TADALAFIL 5 MG PO TABS: 5.0000 mg | ORAL_TABLET | Freq: Every day | ORAL | 3 refills | Status: DC | PRN

## 2017-03-10 NOTE — Progress Notes (Signed)
Patient: Anthony Higgins, Male    DOB: September 06, 1945, 71 y.o.   MRN: [id number] Visit Date: 03/10/2017  Today's Provider: Lelon Huh, MD   Chief Complaint  Patient presents with  . Medicare Wellness  . Hypertension  . Hyperlipidemia   Subjective:    Annual wellness visit Anthony Higgins is a 71 y.o. male. He feels well. He reports exercising yes. He reports he is sleeping well.  -----------------------------------------------------------  Hypertension, follow-up:  BP Readings from Last 3 Encounters:  03/10/17 120/70  08/28/16 132/62  07/30/16 128/72    He was last seen for hypertension 7 months ago.  BP at that visit was 128/72. Management since that visit includes; no changes.He reports good compliance with treatment. He is not having side effects. none He is exercising. He is not adherent to low salt diet.   Outside blood pressures are not checking. He is experiencing none.  Patient denies none.   Cardiovascular risk factors include none.  Use of agents associated with hypertension: none.   ----------------------------------------------------------------     Lipid/Cholesterol, Follow-up:   Last seen for this 7 months ago.  Management since that visit includes; no changes.  Last Lipid Panel:    Component Value Date/Time   CHOL 160 07/30/2016 0000   TRIG 102 07/30/2016 0000   HDL 54 07/30/2016 0000   CHOLHDL 3.0 07/30/2016 0000   LDLCALC 86 07/30/2016 0000    He reports good compliance with treatment. He is not having side effects. none  Wt Readings from Last 3 Encounters:  03/10/17 216 lb (98 kg)  08/28/16 215 lb (97.5 kg)  07/30/16 210 lb (95.3 kg)    ----------------------------------------------------------------  Transient cerebral ischemia, unspecified type From 08/28/2016-no changes, continue ASA and Plavix.   Review of Systems  Constitutional: Negative for chills, diaphoresis and fever.  HENT: Negative for congestion, ear  discharge, ear pain, hearing loss, nosebleeds, sore throat and tinnitus.   Eyes: Negative for photophobia, pain, discharge and redness.  Respiratory: Negative for cough, shortness of breath, wheezing and stridor.   Cardiovascular: Negative for chest pain, palpitations and leg swelling.  Gastrointestinal: Negative for abdominal pain, blood in stool, constipation, diarrhea, nausea and vomiting.  Endocrine: Negative for polydipsia.  Genitourinary: Negative for dysuria, flank pain, frequency, hematuria and urgency.  Musculoskeletal: Negative for back pain, myalgias and neck pain.  Skin: Negative for rash.  Allergic/Immunologic: Negative for environmental allergies.  Neurological: Negative for dizziness, tremors, seizures, weakness and headaches.  Hematological: Does not bruise/bleed easily.  Psychiatric/Behavioral: Negative for hallucinations and suicidal ideas. The patient is not nervous/anxious.   All other systems reviewed and are negative.   Social History   Social History  . Marital status: Divorced    Spouse name: N/A  . Number of children: 2  . Years of education: College   Occupational History  .  [employer]   Social History Main Topics  . Smoking status: Former Smoker    Packs/day: 0.00    Years: 7.00    Types: Cigarettes  . Smokeless tobacco: Never Used     Comment: QUIT SMOKING 1975  . Alcohol use 1.8 oz/week    3 Cans of beer per week     Comment: daily  . Drug use: No  . Sexual activity: Not on file   Other Topics Concern  . Not on file   Social History Narrative  . No narrative on file    Past Medical History:  Diagnosis Date  .  Anemia   . Basal cell carcinoma    basel cell skin cancer  . History of chicken pox 09/17/2016   DID have Chicken Pox. DID have Measles.      Patient Active Problem List   Diagnosis Date Noted  . History of TIA (transient ischemic attack) 08/28/2016  . BPH (benign prostatic hyperplasia) 07/30/2016  .  Hyperlipidemia 09/15/2013  . Fatigue 05/23/2013  . Retrognathia 05/14/2013  . Palpitations 03/19/2013  . Hypertension   . Cataract 05/29/2012  . GERD (gastroesophageal reflux disease) 05/29/2012  . Glaucoma 05/29/2012  . ED (erectile dysfunction) of organic origin 02/01/2010  . Obstructive apnea 01/24/2009  . Colon, diverticulosis 03/14/2004    Past Surgical History:  Procedure Laterality Date  . ADENOIDECTOMY    . BACK SURGERY    . bone graph    . cataract surgery    . COLONOSCOPY    . CYST REMOVAL LEG Left   . GLAUCOMA SURGERY    . HAND SURGERY    . TONSILLECTOMY      His family history includes Heart disease in his sister.      Current Outpatient Prescriptions:  .  aspirin 81 MG tablet, Take 81 mg by mouth daily., Disp: , Rfl:  .  cetirizine (ZYRTEC) 10 MG tablet, Take 5 mg by mouth daily. During allergy season, Disp: , Rfl:  .  cholecalciferol (VITAMIN D) 1000 UNITS tablet, Take 1,000 Units by mouth daily., Disp: , Rfl:  .  clopidogrel (PLAVIX) 75 MG tablet, Take 1 tablet (75 mg total) by mouth daily., Disp: 90 tablet, Rfl: 3 .  famotidine (PEPCID) 10 MG tablet, Take 10 mg by mouth at bedtime., Disp: , Rfl:  .  glucosamine-chondroitin 500-400 MG tablet, Take 1 tablet by mouth 2 (two) times daily., Disp: , Rfl:  .  losartan (COZAAR) 50 MG tablet, TAKE 1 TABLET EVERY DAY **DOSAGE DECREASED, Disp: 90 tablet, Rfl: 3 .  MELATONIN PO, Take by mouth daily. At night, Disp: , Rfl:  .  Multiple Vitamin (MULTIVITAMIN WITH MINERALS) TABS, Take 1 tablet by mouth daily., Disp: , Rfl:  .  Omega-3 Fatty Acids (FISH OIL) 1200 MG CAPS, Take by mouth 2 (two) times daily., Disp: , Rfl:  .  simvastatin (ZOCOR) 20 MG tablet, Take 1 tablet (20 mg total) by mouth at bedtime., Disp: 90 tablet, Rfl: 3 .  tadalafil (CIALIS) 5 MG tablet, Take 0.5 tablets (2.5 mg total) by mouth daily as needed., Disp: 90 tablet, Rfl: 3 .  vitamin B-12 (CYANOCOBALAMIN) 1000 MCG tablet, Take 1,000 mcg by mouth  daily., Disp: , Rfl:  .  vitamin C (ASCORBIC ACID) 500 MG tablet, Take 500 mg by mouth daily., Disp: , Rfl:   Patient Care Team: Birdie Sons, MD as PCP - General (Family Medicine)     Objective:   Vitals: BP 120/70 (BP Location: Right Arm, Patient Position: Sitting, Cuff Size: Large)   Pulse (!) 54   Temp 98.5 F (36.9 C) (Oral)   Resp 16   Ht 6\' 2"  (1.88 m)   Wt 216 lb (98 kg)   SpO2 95%   BMI 27.73 kg/m   Physical Exam   General Appearance:    Alert, cooperative, no distress, appears stated age  Head:    Normocephalic, without obvious abnormality, atraumatic  Eyes:    PERRL, conjunctiva/corneas clear, EOM's intact, fundi    benign, both eyes       Ears:    Normal TM's and external ear canals,  both ears  Nose:   Nares normal, septum midline, mucosa normal, no drainage   or sinus tenderness  Throat:   Lips, mucosa, and tongue normal; teeth and gums normal  Neck:   Supple, symmetrical, trachea midline, no adenopathy;       thyroid:  No enlargement/tenderness/nodules; no carotid   bruit or JVD  Back:     Symmetric, no curvature, ROM normal, no CVA tenderness  Lungs:     Clear to auscultation bilaterally, respirations unlabored  Chest wall:    No tenderness or deformity  Heart:    Regular rate and rhythm, S1 and S2 normal, no murmur, rub   or gallop  Abdomen:     Soft, non-tender, bowel sounds active all four quadrants,    no masses, no organomegaly  Genitalia:    deferred  Rectal:    deferred  Extremities:   Extremities normal, atraumatic, no cyanosis or edema  Pulses:   2+ and symmetric all extremities  Skin:   Skin color, texture, turgor normal, no rashes or lesions  Lymph nodes:   Cervical, supraclavicular, and axillary nodes normal  Neurologic:   CNII-XII intact. Normal strength, sensation and reflexes      throughout    Activities of Daily Living In your present state of health, do you have any difficulty performing the following activities: 03/10/2017    Hearing? N  Vision? Y  Difficulty concentrating or making decisions? N  Walking or climbing stairs? N  Dressing or bathing? N  Doing errands, shopping? N  Some recent data might be hidden    Fall Risk Assessment Fall Risk  03/10/2017  Falls in the past year? No     Depression Screen PHQ 2/9 Scores 03/10/2017  PHQ - 2 Score 0  PHQ- 9 Score 0    Cognitive Testing - 6-CIT  Correct? Score   What year is it? yes 0 0 or 4  What month is it? yes 0 0 or 3  Memorize:    Pia Mau,  42,  [place],      What time is it? (within 1 hour) yes 0 0 or 3  Count backwards from 20 yes 0 0, 2, or 4  Name the months of the year yes 0 0, 2, or 4  Repeat name & address above yes 0 0, 2, 4, 6, 8, or 10       TOTAL SCORE  0/28   Interpretation:  Normal  Normal (0-7) Abnormal (8-28)    Audit-C Alcohol Use Screening  Question Answer Points  How often do you have alcoholic drink? 1 or more times daily 3  On days you do drink alcohol, how many drinks do you typically consume? 2-4 2  How oftey will you drink 6 or more in a total? never 0  Total Score:  5   A score of 3 or more in women, and 4 or more in men indicates increased risk for alcohol abuse, EXCEPT if all of the points are from question 1.     Assessment & Plan:     Annual Wellness Visit  Reviewed patient's Family Medical History Reviewed and updated list of patient's medical providers Assessment of cognitive impairment was done Assessed patient's functional ability Established a written schedule for health screening [place] Completed and Reviewed  Exercise Activities and Dietary recommendations Goals    None      Immunization History  Administered Date(s) Administered  . Influenza-Unspecified 04/26/2013  .  Pneumococcal Polysaccharide-23 09/30/2011  . Tdap 09/30/2011  . Zoster 02/01/2010    Health Maintenance  Topic Date Due  . Hepatitis C Screening  [birth year]  . PNA vac Low  Risk Adult (2 of 2 - PCV13) 09/29/2012  . INFLUENZA VACCINE  03/26/2017  . Fecal DNA (Cologuard)  10/09/2019  . TETANUS/TDAP  09/29/2021     Discussed health benefits of physical activity, and encouraged him to engage in regular exercise appropriate for his age and condition.    --------------------------------------------------------------------------  1. Medicare annual wellness visit, subsequent Generally doing well. 2. Hypertension, unspecified type Well controlled.  Continue current medications.   - Comprehensive metabolic panel - EKG 85-TMBP  3. BMI 27.0-27.9,adult Physical active with healthy body havitus.   4. Need for pneumococcal vaccination  He had vaccines at Pharmacy in [place] last year and is going to see if had prevnar-13   5. Need for hepatitis C screening test  - Hepatitis C antibody  6. Prostate cancer screening  - PSA  7. Benign prostatic hyperplasia with nocturia  - tadalafil (CIALIS) 5 MG tablet; Take 1 tablet (5 mg total) by mouth daily as needed.  Dispense: 90 tablet; Refill: 3  8. Hyperlipidemia, unspecified hyperlipidemia type He is tolerating simvastatin well with no adverse effects.   - Lipid panel  9. History of TIA (transient ischemic attack) No residual symptoms. Continue current plan of care.     Lelon Huh, MD  [facility]

## 2017-03-10 NOTE — Patient Instructions (Addendum)
   A Single dose of Prevnar-13 vaccine is recommended for all patients 65 and over. Please check with your pharmacist to see if you have had this vaccine.    Check with your pharmacist about the new shingles vaccine (Shingrix)

## 2017-03-11 LAB — COMPREHENSIVE METABOLIC PANEL
ALBUMIN: 4.4 g/dL (ref 3.5–4.8)
ALK PHOS: 65 IU/L (ref 39–117)
ALT: 31 IU/L (ref 0–44)
AST: 24 IU/L (ref 0–40)
Albumin/Globulin Ratio: 1.8 (ref 1.2–2.2)
BUN/Creatinine Ratio: 14 (ref 10–24)
BUN: 14 mg/dL (ref 8–27)
Bilirubin Total: 0.7 mg/dL (ref 0.0–1.2)
CO2: 22 mmol/L (ref 20–29)
CREATININE: 0.98 mg/dL (ref 0.76–1.27)
Calcium: 10 mg/dL (ref 8.6–10.2)
Chloride: 106 mmol/L (ref 96–106)
GFR calc Af Amer: 90 mL/min/{1.73_m2} (ref >59)
GFR calc non Af Amer: 78 mL/min/{1.73_m2} (ref >59)
GLUCOSE: 99 mg/dL (ref 65–99)
Globulin, Total: 2.5 g/dL (ref 1.5–4.5)
Potassium: 4.9 mmol/L (ref 3.5–5.2)
Sodium: 143 mmol/L (ref 134–144)
Total Protein: 6.9 g/dL (ref 6.0–8.5)

## 2017-03-11 LAB — LIPID PANEL
CHOLESTEROL TOTAL: 136 mg/dL (ref 100–199)
Chol/HDL Ratio: 3.2 ratio (ref 0.0–5.0)
HDL: 42 mg/dL (ref >39)
LDL Calculated: 69 mg/dL (ref 0–99)
Triglycerides: 124 mg/dL (ref 0–149)
VLDL CHOLESTEROL CAL: 25 mg/dL (ref 5–40)

## 2017-03-11 LAB — PSA: PROSTATE SPECIFIC AG, SERUM: 1.6 ng/mL (ref 0.0–4.0)

## 2017-03-11 LAB — HEPATITIS C ANTIBODY: Hep C Virus Ab: <0.1 s/co ratio (ref 0.0–0.9)

## 2017-04-03 ENCOUNTER — Telehealth: Payer: Self-pay | Admitting: Family Medicine

## 2017-04-03 DIAGNOSIS — N401 Enlarged prostate with lower urinary tract symptoms: Secondary | ICD-10-CM

## 2017-04-03 DIAGNOSIS — R351 Nocturia: Principal | ICD-10-CM

## 2017-04-03 MED ORDER — TADALAFIL 5 MG PO TABS: 5.0000 mg | ORAL_TABLET | Freq: Every day | ORAL | 3 refills | Status: DC | PRN

## 2017-04-03 NOTE — Telephone Encounter (Signed)
Pt is requesting a written Rx for tadalafil (CIALIS) 5 MG tablet  due to pt does not want this sent through [facility].  Pt will be sending this in himself so he can get the generic.  ZD#[phone]/[id number]

## 2017-04-03 NOTE — Telephone Encounter (Signed)
Please advise refill? 

## 2017-04-04 ENCOUNTER — Ambulatory Visit (INDEPENDENT_AMBULATORY_CARE_PROVIDER_SITE_OTHER): Payer: Medicare Other | Admitting: Family Medicine

## 2017-04-04 DIAGNOSIS — Z23 Encounter for immunization: Secondary | ICD-10-CM

## 2017-04-06 NOTE — Progress Notes (Signed)
Immunization administration only. No E&M service today.

## 2017-04-17 DIAGNOSIS — H44422 Hypotony of left eye due to ocular fistula: Secondary | ICD-10-CM | POA: Diagnosis not present

## 2017-04-17 DIAGNOSIS — Z9889 Other specified postprocedural states: Secondary | ICD-10-CM | POA: Diagnosis not present

## 2017-04-17 DIAGNOSIS — H401124 Primary open-angle glaucoma, left eye, indeterminate stage: Secondary | ICD-10-CM | POA: Diagnosis not present

## 2017-04-17 DIAGNOSIS — Z9883 Filtering (vitreous) bleb after glaucoma surgery status: Secondary | ICD-10-CM | POA: Diagnosis not present

## 2017-04-17 DIAGNOSIS — H5989 Other postprocedural complications and disorders of eye and adnexa, not elsewhere classified: Secondary | ICD-10-CM | POA: Diagnosis not present

## 2017-04-17 DIAGNOSIS — H44432 Hypotony of eye due to other ocular disorders, left eye: Secondary | ICD-10-CM | POA: Diagnosis not present

## 2017-04-29 ENCOUNTER — Telehealth: Payer: Self-pay | Admitting: Family Medicine

## 2017-04-29 MED ORDER — CLOPIDOGREL BISULFATE 75 MG PO TABS
75.0000 mg | ORAL_TABLET | Freq: Every day | ORAL | 3 refills | Status: DC
Start: 2017-04-29 — End: 2018-03-26

## 2017-04-29 MED ORDER — SIMVASTATIN 20 MG PO TABS: 20.0000 mg | ORAL_TABLET | Freq: Every day | ORAL | 3 refills | Status: DC

## 2017-04-29 NOTE — Telephone Encounter (Addendum)
[facility] faxed a request on the following medications. Thanks [facility]  simvastatin (ZOCOR) 20 MG tablet   clopidogrel (PLAVIX) 75 MG tablet

## 2017-04-30 ENCOUNTER — Other Ambulatory Visit: Payer: Self-pay | Admitting: Family Medicine

## 2017-04-30 DIAGNOSIS — N401 Enlarged prostate with lower urinary tract symptoms: Secondary | ICD-10-CM

## 2017-04-30 DIAGNOSIS — R351 Nocturia: Principal | ICD-10-CM

## 2017-04-30 NOTE — Telephone Encounter (Addendum)
[facility] faxed a request on the following medications. Thanks CC  tadalafil (CIALIS) 5 MG tablet   losartan (COZAAR) 50 MG tablet

## 2017-05-01 MED ORDER — LOSARTAN POTASSIUM 50 MG PO TABS: ORAL_TABLET | ORAL | 3 refills | Status: DC

## 2017-05-05 ENCOUNTER — Other Ambulatory Visit: Payer: Self-pay | Admitting: Family Medicine

## 2017-05-05 DIAGNOSIS — R351 Nocturia: Principal | ICD-10-CM

## 2017-05-05 DIAGNOSIS — N401 Enlarged prostate with lower urinary tract symptoms: Secondary | ICD-10-CM

## 2017-05-05 NOTE — Telephone Encounter (Signed)
Pt needs refill on Celasis.  [facility]  Thanks [employer]

## 2017-05-07 MED ORDER — TADALAFIL 5 MG PO TABS: 5.0000 mg | ORAL_TABLET | Freq: Every day | ORAL | 4 refills | Status: DC | PRN

## 2017-06-23 DIAGNOSIS — Z23 Encounter for immunization: Secondary | ICD-10-CM | POA: Diagnosis not present

## 2017-09-09 DIAGNOSIS — L111 Transient acantholytic dermatosis [Grover]: Secondary | ICD-10-CM | POA: Diagnosis not present

## 2017-09-09 DIAGNOSIS — Z85828 Personal history of other malignant neoplasm of skin: Secondary | ICD-10-CM | POA: Diagnosis not present

## 2017-09-09 DIAGNOSIS — L570 Actinic keratosis: Secondary | ICD-10-CM | POA: Diagnosis not present

## 2017-09-09 DIAGNOSIS — D1801 Hemangioma of skin and subcutaneous tissue: Secondary | ICD-10-CM | POA: Diagnosis not present

## 2017-09-09 DIAGNOSIS — L821 Other seborrheic keratosis: Secondary | ICD-10-CM | POA: Diagnosis not present

## 2017-09-09 DIAGNOSIS — L812 Freckles: Secondary | ICD-10-CM | POA: Diagnosis not present

## 2017-10-07 DIAGNOSIS — H401122 Primary open-angle glaucoma, left eye, moderate stage: Secondary | ICD-10-CM | POA: Diagnosis not present

## 2017-10-07 DIAGNOSIS — Z961 Presence of intraocular lens: Secondary | ICD-10-CM | POA: Diagnosis not present

## 2017-10-07 DIAGNOSIS — H401111 Primary open-angle glaucoma, right eye, mild stage: Secondary | ICD-10-CM | POA: Diagnosis not present

## 2017-10-07 DIAGNOSIS — H44432 Hypotony of eye due to other ocular disorders, left eye: Secondary | ICD-10-CM | POA: Diagnosis not present

## 2017-10-10 DIAGNOSIS — M25512 Pain in left shoulder: Secondary | ICD-10-CM | POA: Diagnosis not present

## 2017-10-10 DIAGNOSIS — M25511 Pain in right shoulder: Secondary | ICD-10-CM | POA: Diagnosis not present

## 2017-11-21 DIAGNOSIS — M25511 Pain in right shoulder: Secondary | ICD-10-CM | POA: Diagnosis not present

## 2018-01-20 DIAGNOSIS — H44432 Hypotony of eye due to other ocular disorders, left eye: Secondary | ICD-10-CM | POA: Diagnosis not present

## 2018-01-20 DIAGNOSIS — H401122 Primary open-angle glaucoma, left eye, moderate stage: Secondary | ICD-10-CM | POA: Diagnosis not present

## 2018-02-16 DIAGNOSIS — D1801 Hemangioma of skin and subcutaneous tissue: Secondary | ICD-10-CM | POA: Diagnosis not present

## 2018-02-16 DIAGNOSIS — L570 Actinic keratosis: Secondary | ICD-10-CM | POA: Diagnosis not present

## 2018-02-16 DIAGNOSIS — L111 Transient acantholytic dermatosis [Grover]: Secondary | ICD-10-CM | POA: Diagnosis not present

## 2018-02-16 DIAGNOSIS — L821 Other seborrheic keratosis: Secondary | ICD-10-CM | POA: Diagnosis not present

## 2018-02-16 DIAGNOSIS — Z85828 Personal history of other malignant neoplasm of skin: Secondary | ICD-10-CM | POA: Diagnosis not present

## 2018-02-16 DIAGNOSIS — L812 Freckles: Secondary | ICD-10-CM | POA: Diagnosis not present

## 2018-03-06 DIAGNOSIS — M545 Low back pain: Secondary | ICD-10-CM | POA: Diagnosis not present

## 2018-03-14 DIAGNOSIS — M545 Low back pain: Secondary | ICD-10-CM | POA: Diagnosis not present

## 2018-03-19 DIAGNOSIS — M5136 Other intervertebral disc degeneration, lumbar region: Secondary | ICD-10-CM | POA: Diagnosis not present

## 2018-03-19 DIAGNOSIS — M545 Low back pain: Secondary | ICD-10-CM | POA: Diagnosis not present

## 2018-03-19 DIAGNOSIS — M5126 Other intervertebral disc displacement, lumbar region: Secondary | ICD-10-CM | POA: Diagnosis not present

## 2018-03-19 DIAGNOSIS — M4310 Spondylolisthesis, site unspecified: Secondary | ICD-10-CM | POA: Diagnosis not present

## 2018-03-24 DIAGNOSIS — H401122 Primary open-angle glaucoma, left eye, moderate stage: Secondary | ICD-10-CM | POA: Diagnosis not present

## 2018-03-24 DIAGNOSIS — H401111 Primary open-angle glaucoma, right eye, mild stage: Secondary | ICD-10-CM | POA: Diagnosis not present

## 2018-03-24 DIAGNOSIS — Z961 Presence of intraocular lens: Secondary | ICD-10-CM | POA: Diagnosis not present

## 2018-03-26 ENCOUNTER — Other Ambulatory Visit: Payer: Self-pay | Admitting: Family Medicine

## 2018-04-07 ENCOUNTER — Ambulatory Visit (INDEPENDENT_AMBULATORY_CARE_PROVIDER_SITE_OTHER): Payer: Medicare Other

## 2018-04-07 VITALS — BP 130/72 | HR 49 | Temp 98.5°F | Ht 74.0 in | Wt 213.2 lb

## 2018-04-07 DIAGNOSIS — Z0000 Encounter for general adult medical examination without abnormal findings: Secondary | ICD-10-CM

## 2018-04-07 NOTE — Patient Instructions (Addendum)
[name] , Thank you for taking time to come for your Medicare Wellness Visit. I appreciate your ongoing commitment to your health goals. Please review the following plan we discussed and let me know if I can assist you in the future.   Screening recommendations/referrals: Colonoscopy: Up to date Recommended yearly ophthalmology/optometry visit for glaucoma screening and checkup Recommended yearly dental visit for hygiene and checkup  Vaccinations: Influenza vaccine: N/A Pneumococcal vaccine: Up to date Tdap vaccine: Up to date Shingles vaccine: Pt declines today.     Advanced directives: Please bring a copy of your POA (Power of Attorney) and/or Living Will to your next appointment.   Conditions/risks identified: Recommend cutting back on eating prepackaged dinners to cut back on sodium intake.   Next appointment: 04/09/18 @ 10 AM with Dr Anthony Higgins. Declined scheduling AWV for 2020.  Preventive Care 55 Years and Older, Male Preventive care refers to lifestyle choices and visits with your health care provider that can promote health and wellness. What does preventive care include?  A yearly physical exam. This is also called an annual well check.  Dental exams once or twice a year.  Routine eye exams. Ask your health care provider how often you should have your eyes checked.  Personal lifestyle choices, including:  Daily care of your teeth and gums.  Regular physical activity.  Eating a healthy diet.  Avoiding tobacco and drug use.  Limiting alcohol use.  Practicing safe sex.  Taking low doses of aspirin every day.  Taking vitamin and mineral supplements as recommended by your health care provider. What happens during an annual well check? The services and screenings done by your health care provider during your annual well check will depend on your age, overall health, lifestyle risk factors, and family history of disease. Counseling  Your health care provider may  ask you questions about your:  Alcohol use.  Tobacco use.  Drug use.  Emotional well-being.  Home and relationship well-being.  Sexual activity.  Eating habits.  History of falls.  Memory and ability to understand (cognition).  Work and work Statistician. Screening  You may have the following tests or measurements:  Height, weight, and BMI.  Blood pressure.  Lipid and cholesterol levels. These may be checked every 5 years, or more frequently if you are over 72 years old.  Skin check.  Lung cancer screening. You may have this screening every year starting at age 72 if you have a 30-pack-year history of smoking and currently smoke or have quit within the past 15 years.  Fecal occult blood test (FOBT) of the stool. You may have this test every year starting at age 72.  Flexible sigmoidoscopy or colonoscopy. You may have a sigmoidoscopy every 5 years or a colonoscopy every 10 years starting at age 72.  Prostate cancer screening. Recommendations will vary depending on your family history and other risks.  Hepatitis C blood test.  Hepatitis B blood test.  Sexually transmitted disease (STD) testing.  Diabetes screening. This is done by checking your blood sugar (glucose) after you have not eaten for a while (fasting). You may have this done every 1-3 years.  Abdominal aortic aneurysm (AAA) screening. You may need this if you are a current or former smoker.  Osteoporosis. You may be screened starting at age 60 if you are at high risk. Talk with your health care provider about your test results, treatment options, and if necessary, the need for more tests. Vaccines  Your health care provider  may recommend certain vaccines, such as:  Influenza vaccine. This is recommended every year.  Tetanus, diphtheria, and acellular pertussis (Tdap, Td) vaccine. You may need a Td booster every 10 years.  Zoster vaccine. You may need this after age 72.  Pneumococcal 13-valent  conjugate (PCV13) vaccine. One dose is recommended after age 72.  Pneumococcal polysaccharide (PPSV23) vaccine. One dose is recommended after age 71. Talk to your health care provider about which screenings and vaccines you need and how often you need them. This information is not intended to replace advice given to you by your health care provider. Make sure you discuss any questions you have with your health care provider. Document Released: 09/08/2015 Document Revised: 05/01/2016 Document Reviewed: 06/13/2015 Elsevier Interactive Patient Education  2017 [place] Prevention in the Home Falls can cause injuries. They can happen to people of all ages. There are many things you can do to make your home safe and to help prevent falls. What can I do on the outside of my home?  Regularly fix the edges of walkways and driveways and fix any cracks.  Remove anything that might make you trip as you walk through a door, such as a raised step or threshold.  Trim any bushes or trees on the path to your home.  Use bright outdoor lighting.  Clear any walking paths of anything that might make someone trip, such as rocks or tools.  Regularly check to see if handrails are loose or broken. Make sure that both sides of any steps have handrails.  Any raised decks and porches should have guardrails on the edges.  Have any leaves, snow, or ice cleared regularly.  Use sand or salt on walking paths during winter.  Clean up any spills in your garage right away. This includes oil or grease spills. What can I do in the bathroom?  Use night lights.  Install grab bars by the toilet and in the tub and shower. Do not use towel bars as grab bars.  Use non-skid mats or decals in the tub or shower.  If you need to sit down in the shower, use a plastic, non-slip stool.  Keep the floor dry. Clean up any water that spills on the floor as soon as it happens.  Remove soap buildup in the tub or  shower regularly.  Attach bath mats securely with double-sided non-slip rug tape.  Do not have throw rugs and other things on the floor that can make you trip. What can I do in the bedroom?  Use night lights.  Make sure that you have a light by your bed that is easy to reach.  Do not use any sheets or blankets that are too big for your bed. They should not hang down onto the floor.  Have a firm chair that has side arms. You can use this for support while you get dressed.  Do not have throw rugs and other things on the floor that can make you trip. What can I do in the kitchen?  Clean up any spills right away.  Avoid walking on wet floors.  Keep items that you use a lot in easy-to-reach places.  If you need to reach something above you, use a strong step stool that has a grab bar.  Keep electrical cords out of the way.  Do not use floor polish or wax that makes floors slippery. If you must use wax, use non-skid floor wax.  Do not have throw rugs  and other things on the floor that can make you trip. What can I do with my stairs?  Do not leave any items on the stairs.  Make sure that there are handrails on both sides of the stairs and use them. Fix handrails that are broken or loose. Make sure that handrails are as long as the stairways.  Check any carpeting to make sure that it is firmly attached to the stairs. Fix any carpet that is loose or worn.  Avoid having throw rugs at the top or bottom of the stairs. If you do have throw rugs, attach them to the floor with carpet tape.  Make sure that you have a light switch at the top of the stairs and the bottom of the stairs. If you do not have them, ask someone to add them for you. What else can I do to help prevent falls?  Wear shoes that:  Do not have high heels.  Have rubber bottoms.  Are comfortable and fit you well.  Are closed at the toe. Do not wear sandals.  If you use a stepladder:  Make sure that it is fully  opened. Do not climb a closed stepladder.  Make sure that both sides of the stepladder are locked into place.  Ask someone to hold it for you, if possible.  Clearly mark and make sure that you can see:  Any grab bars or handrails.  First and last steps.  Where the edge of each step is.  Use tools that help you move around (mobility aids) if they are needed. These include:  Canes.  Walkers.  Scooters.  Crutches.  Turn on the lights when you go into a dark area. Replace any light bulbs as soon as they burn out.  Set up your furniture so you have a clear path. Avoid moving your furniture around.  If any of your floors are uneven, fix them.  If there are any pets around you, be aware of where they are.  Review your medicines with your doctor. Some medicines can make you feel dizzy. This can increase your chance of falling. Ask your doctor what other things that you can do to help prevent falls. This information is not intended to replace advice given to you by your health care provider. Make sure you discuss any questions you have with your health care provider. Document Released: 06/08/2009 Document Revised: 01/18/2016 Document Reviewed: 09/16/2014 Elsevier Interactive Patient Education  2017 [employer].

## 2018-04-07 NOTE — Progress Notes (Signed)
Patient: Anthony Higgins Male    DOB: 22-Nov-1945   72 y.o.   MRN: [id number] Visit Date: 04/09/2018  Today's Provider: Lelon Huh, MD   Chief Complaint  Patient presents with  . Hypertension    follow up  . Hyperlipidemia    follow up   Subjective:   Patient saw [facility] for AWV on 04/07/2018.  HPI   Hypertension, follow-up:  BP Readings from Last 3 Encounters:  04/09/18 116/72  04/07/18 130/72  03/10/17 120/70    He was last seen for hypertension 13 months ago.  BP at that visit was 120/70. Management since that visit includes; no changes.He reports good compliance with treatment. He is not having side effects.  He is exercising. He is not adherent to low salt diet.   Outside blood pressures are not being checked.  He continues to play tennis three times a week with no restrictions. .  Patient denies chest pain, chest pressure/discomfort, claudication, dyspnea, exertional chest pressure/discomfort, fatigue, irregular heart beat, lower extremity edema, near-syncope, orthopnea, palpitations, paroxysmal nocturnal dyspnea, syncope and tachypnea.   Cardiovascular risk factors include advanced age (older than 95 for men, 26 for women), hypertension and male gender.  Use of agents associated with hypertension: NSAIDS.   He states today that his previously cardiologist in [place] that he has an abnormality on his stress test and he should have cardiac evaluation periodically, although patient does not remember nature of abnormality and we have no records of test.  ------------------------------------------------------------------------    Lipid/Cholesterol, Follow-up:   Last seen for this 13 months ago.  Management since that visit includes; labs checked, no changes.  Last Lipid Panel:    Component Value Date/Time   CHOL 136 03/10/2017 1027   TRIG 124 03/10/2017 1027   HDL 42 03/10/2017 1027   CHOLHDL 3.2 03/10/2017 1027   LDLCALC 69 03/10/2017 1027     He reports good compliance with treatment. He is not having side effects.   Wt Readings from Last 3 Encounters:  04/09/18 213 lb (96.6 kg)  04/07/18 213 lb 3.2 oz (96.7 kg)  03/10/17 216 lb (98 kg)    ------------------------------------------------------------------------     Benign prostatic hyperplasia with nocturia From 03/10/2017-increased tadalafil (CIALIS) 5 MG tablet. Patient reports good compliance with treatment, is tolerating well and has been effective.   History of TIA (transient ischemic attack) From 03/10/2017-no changes. Continue current plan of care. Patient reports good compliance with treatment. Has had no additional episodes since since adding clopidogrel to ASA in December 2017.    Allergies  Allergen Reactions  . Latex Itching  . Lipitor [Atorvastatin]   . Lisinopril Cough  . Pravachol [Pravastatin Sodium]      Current Outpatient Medications:  .  acetaminophen (TYLENOL) 650 MG CR tablet, Take 650 mg by mouth every 8 (eight) hours as needed for pain., Disp: , Rfl:  .  aspirin 81 MG tablet, Take 81 mg by mouth daily., Disp: , Rfl:  .  cetirizine (ZYRTEC) 10 MG tablet, Take 5 mg by mouth daily. During allergy season, Disp: , Rfl:  .  cholecalciferol (VITAMIN D) 1000 UNITS tablet, Take 1,000 Units by mouth daily., Disp: , Rfl:  .  clopidogrel (PLAVIX) 75 MG tablet, TAKE 1 TABLET BY MOUTH  DAILY, Disp: 90 tablet, Rfl: 4 .  famotidine (PEPCID) 10 MG tablet, Take 10 mg by mouth at bedtime., Disp: , Rfl:  .  glucosamine-chondroitin 500-400 MG tablet, Take 1 tablet by mouth  2 (two) times daily., Disp: , Rfl:  .  losartan (COZAAR) 50 MG tablet, TAKE 1 TABLET EVERY DAY - DOSAGE DECREASED, Disp: 90 tablet, Rfl: 4 .  MELATONIN PO, Take by mouth daily. At night, Disp: , Rfl:  .  Multiple Vitamin (MULTIVITAMIN WITH MINERALS) TABS, Take 1 tablet by mouth daily., Disp: , Rfl:  .  Omega-3 Fatty Acids (FISH OIL) 1200 MG CAPS, Take by mouth 2 (two) times daily., Disp: ,  Rfl:  .  simvastatin (ZOCOR) 20 MG tablet, TAKE 1 TABLET BY MOUTH AT  BEDTIME, Disp: 90 tablet, Rfl: 4 .  tadalafil (CIALIS) 5 MG tablet, Take 1 tablet (5 mg total) by mouth daily as needed., Disp: 90 tablet, Rfl: 4 .  vitamin B-12 (CYANOCOBALAMIN) 1000 MCG tablet, Take 1,000 mcg by mouth daily., Disp: , Rfl:  .  vitamin C (ASCORBIC ACID) 500 MG tablet, Take 500 mg by mouth daily., Disp: , Rfl:   Review of Systems  Constitutional: Negative for appetite change, chills and fever.  Respiratory: Negative for chest tightness, shortness of breath and wheezing.   Cardiovascular: Negative for chest pain and palpitations.  Gastrointestinal: Negative for abdominal pain, nausea and vomiting.  Genitourinary: Positive for frequency.  Musculoskeletal: Positive for arthralgias and back pain.  Allergic/Immunologic: Positive for environmental allergies.    Social History   Tobacco Use  . Smoking status: Former Smoker    Packs/day: 0.00    Years: 7.00    Pack years: 0.00    Types: Cigarettes  . Smokeless tobacco: Never Used  . Tobacco comment: [place]  Substance Use Topics  . Alcohol use: Yes    Alcohol/week: 4.0 standard drinks    Types: 4 Cans of beer per week   Objective:   BP 116/72 (BP Location: Left Arm, Patient Position: Sitting, Cuff Size: Large)   Pulse (!) 59   Temp 97.6 F (36.4 C) (Oral)   Resp 16   Ht 6\' 2"  (1.88 m)   Wt 213 lb (96.6 kg)   SpO2 98% Comment: room air  BMI 27.35 kg/m  Vitals:   04/09/18 1022  BP: 116/72  Pulse: (!) 59  Resp: 16  Temp: 97.6 F (36.4 C)  TempSrc: Oral  SpO2: 98%  Weight: 213 lb (96.6 kg)  Height: 6\' 2"  (1.88 m)     Physical Exam   General Appearance:    Alert, cooperative, no distress  Eyes:    PERRL, conjunctiva/corneas clear, EOM's intact       Lungs:     Clear to auscultation bilaterally, respirations unlabored  Heart:    Regular rate and rhythm  Neurologic:   Awake, alert, oriented x 3. No apparent focal  neurological           defect.           Assessment & Plan:     1. Benign prostatic hyperplasia with nocturia Well controlled.  Continue current medications.   - tadalafil (CIALIS) 5 MG tablet; Take 1 tablet (5 mg total) by mouth daily as needed.  Dispense: 90 tablet; Refill: 4  2. History of TIA (transient ischemic attack) No further sx since adding clopidogrel to [facility]AS[facility] which will be continued for now.   3. Hyperlipidemia, unspecified hyperlipidemia type He is tolerating simvastatin well with no adverse effects.   - Comprehensive metabolic panel - Lipid panel  4. Hypertension, unspecified type Well controlled.  Continue current medications.    5. Prostate cancer screening  - PSA  6. Cardiac  function test abnormal By patient report was several years ago while living in [place]. Is currently asymptomatic. I have no records from this time and he did have normal stress echo in 2014 when he was having some palpitations. Will see if he can get more specific information and consider cardiology referral for follow up.        Lelon Huh, MD  [facility]

## 2018-04-07 NOTE — Progress Notes (Signed)
Subjective:   Anthony Higgins is a 72 y.o. male who presents for an Initial Medicare Annual Wellness Visit.  Review of Systems  N/A  Cardiac Risk Factors include: advanced age (>32men, >2 women);male gender;dyslipidemia    Objective:    Today's Vitals   04/07/18 1049 04/07/18 1113  BP: (!) 152/80 130/72  Pulse: (!) 50 (!) 49  Temp: 98.5 F (36.9 C)   TempSrc: Oral   Weight: 213 lb 3.2 oz (96.7 kg)   Height: 6\' 2"  (1.88 m)   PainSc: 0-No pain    Body mass index is 27.37 kg/m.  Advanced Directives 04/07/2018  Does Patient Have a Medical Advance Directive? No  Would patient like information on creating a medical advance directive? No - Patient declined    Current Medications (verified) Outpatient Encounter Medications as of 04/07/2018  Medication Sig  . acetaminophen (TYLENOL) 650 MG CR tablet Take 650 mg by mouth every 8 (eight) hours as needed for pain.  Marland Kitchen aspirin 81 MG tablet Take 81 mg by mouth daily.  . cetirizine (ZYRTEC) 10 MG tablet Take 5 mg by mouth daily. During allergy season  . cholecalciferol (VITAMIN D) 1000 UNITS tablet Take 1,000 Units by mouth daily.  . clopidogrel (PLAVIX) 75 MG tablet TAKE 1 TABLET BY MOUTH  DAILY  . famotidine (PEPCID) 10 MG tablet Take 10 mg by mouth at bedtime.  Marland Kitchen glucosamine-chondroitin 500-400 MG tablet Take 1 tablet by mouth 2 (two) times daily.  Marland Kitchen losartan (COZAAR) 50 MG tablet TAKE 1 TABLET EVERY DAY - DOSAGE DECREASED  . MELATONIN PO Take by mouth daily. At night  . Multiple Vitamin (MULTIVITAMIN WITH MINERALS) TABS Take 1 tablet by mouth daily.  . Omega-3 Fatty Acids (FISH OIL) 1200 MG CAPS Take by mouth 2 (two) times daily.  . simvastatin (ZOCOR) 20 MG tablet TAKE 1 TABLET BY MOUTH AT  BEDTIME  . tadalafil (CIALIS) 5 MG tablet Take 1 tablet (5 mg total) by mouth daily as needed.  . vitamin B-12 (CYANOCOBALAMIN) 1000 MCG tablet Take 1,000 mcg by mouth daily.  . vitamin C (ASCORBIC ACID) 500 MG tablet Take 500 mg by mouth  daily.   No facility-administered encounter medications on file as of 04/07/2018.     Allergies (verified) Latex; Lipitor [atorvastatin]; Lisinopril; and Pravachol [pravastatin sodium]   History: Past Medical History:  Diagnosis Date  . Anemia   . Basal cell carcinoma    basel cell skin cancer  . Dislocated shoulder   . History of chicken pox 09/17/2016   DID have Chicken Pox. DID have Measles.   . Hyperlipidemia   . Hypertension    Past Surgical History:  Procedure Laterality Date  . ADENOIDECTOMY    . BACK SURGERY    . bone graph    . cataract surgery    . COLONOSCOPY    . CYST REMOVAL LEG Left   . GLAUCOMA SURGERY    . HAND SURGERY    . TONSILLECTOMY     Family History  Problem Relation Age of Onset  . Heart disease Sister   . Dementia Sister    Social History   Socioeconomic History  . Marital status: Divorced    Spouse name: Not on file  . Number of children: 2  . Years of education: College  . Highest education level: Bachelor's degree (e.g., BA, AB, BS)  Occupational History  . Occupation: retired    Fish farm manager: [employer]  . Financial resource strain:  Not hard at all  . Food insecurity:    Worry: Never true    Inability: Never true  . Transportation needs:    Medical: Not on file    Non-medical: Not on file  Tobacco Use  . Smoking status: Former Smoker    Packs/day: 0.00    Years: 7.00    Pack years: 0.00    Types: Cigarettes  . Smokeless tobacco: Never Used  . Tobacco comment: [place]  Substance and Sexual Activity  . Alcohol use: Yes    Alcohol/week: 4.0 standard drinks    Types: 4 Cans of beer per week  . Drug use: No  . Sexual activity: Not on file  Lifestyle  . Physical activity:    Days per week: Not on file    Minutes per session: Not on file  . Stress: Not at all  Relationships  . Social connections:    Talks on phone: Not on file    Gets together: Not on file    Attends religious service:  Not on file    Active member of club or organization: Not on file    Attends meetings of clubs or organizations: Not on file    Relationship status: Not on file  Other Topics Concern  . Not on file  Social History Narrative  . Not on file   Tobacco Counseling Counseling given: Not Answered Comment: QUIT SMOKING 1975   Clinical Intake:  Pre-visit preparation completed: Yes  Pain : No/denies pain Pain Score: 0-No pain     Nutritional Status: BMI 25 -29 Overweight Nutritional Risks: None Diabetes: No  How often do you need to have someone help you when you read instructions, pamphlets, or other written materials from your doctor or pharmacy?: 1 - Never  Interpreter Needed?: No  Information entered by :: [facility], LPN  Activities of Daily Living In your present state of health, do you have any difficulty performing the following activities: 04/07/2018  Hearing? N  Vision? N  Difficulty concentrating or making decisions? N  Walking or climbing stairs? N  Dressing or bathing? N  Doing errands, shopping? N  Preparing Food and eating ? N  Using the Toilet? N  In the past six months, have you accidently leaked urine? N  Do you have problems with loss of bowel control? N  Managing your Medications? N  Managing your Finances? N  Housekeeping or managing your Housekeeping? N  Some recent data might be hidden     Immunizations and Health Maintenance Immunization History  Administered Date(s) Administered  . Influenza,inj,quad, With Preservative 05/26/2017  . Influenza-Unspecified 04/26/2013, 04/29/2016  . Pneumococcal Conjugate-13 02/24/2016, 04/04/2017  . Pneumococcal Polysaccharide-23 09/30/2011  . Tdap 09/30/2011  . Zoster 02/01/2010   Health Maintenance Due  Topic Date Due  . INFLUENZA VACCINE  03/26/2018    Patient Care Team: Birdie Sons, MD as PCP - General (Family Medicine) Evangeline Gula, MD as Referring Physician (Ophthalmology) Thompson Grayer, MD as Consulting Physician (Cardiology)  Indicate any recent Medical Services you may have received from other than [facility] providers in the past year (date may be approximate).    Assessment:   This is a routine wellness examination for Devon.  Hearing/Vision screen Vision Screening Comments: Pt sees Dr. Volanda Napoleon for vision checks every 6 months.   Dietary issues and exercise activities discussed: Current Exercise Habits: Home exercise routine;Structured exercise class, Type of exercise: Other - see comments;walking(tennis), Time (Minutes): > 60(1.5-2 hrs at a  time), Frequency (Times/Week): 6, Weekly Exercise (Minutes/Week): 0, Intensity: Mild, Exercise limited by: orthopedic condition(s)  Goals    . DIET - REDUCE SODIUM INTAKE     Recommend cutting back on eating prepackaged dinners to cut back on sodium intake.       Depression Screen PHQ 2/9 Scores 04/07/2018 04/07/2018 03/10/2017  PHQ - 2 Score 0 0 0  PHQ- 9 Score 0 - 0    Fall Risk Fall Risk  04/07/2018 03/10/2017  Falls in the past year? No No    Is the patient's home free of loose throw rugs in walkways, pet beds, electrical cords, etc?   yes      Grab bars in the bathroom? no      Handrails on the stairs?   yes      Adequate lighting?   yes  Timed Get Up and Go performed: N/A  Cognitive Function: Pt declined the screening today.         Screening Tests Health Maintenance  Topic Date Due  . INFLUENZA VACCINE  03/26/2018  . Fecal DNA (Cologuard)  10/09/2019  . TETANUS/TDAP  09/29/2021  . Hepatitis C Screening  Completed  . PNA vac Low Risk Adult  Completed    Qualifies for Shingles Vaccine? Due for Shingles vaccine. Declined my offer to administer today. Education has been provided regarding the importance of this vaccine. Pt has been advised to call her insurance company to determine her out of pocket expense. Advised she may also receive this vaccine at her local pharmacy or Health Dept. Verbalized acceptance  and understanding.  Cancer Screenings: Lung: Low Dose CT Chest recommended if Age 80-80 years, 30 pack-year currently smoking OR have quit w/in 15years. Patient does not qualify. Colorectal: Up to date  Additional Screenings:  Hepatitis C Screening: Up to date      Plan:  I have personally reviewed and addressed the Medicare Annual Wellness questionnaire and have noted the following in the patient's chart:  A. Medical and social history B. Use of alcohol, tobacco or illicit drugs  C. Current medications and supplements D. Functional ability and status E.  Nutritional status F.  Physical activity G. Advance directives H. List of other physicians I.  Hospitalizations, surgeries, and ER visits in previous 12 months J.  [place] such as hearing and vision if needed, cognitive and depression L. Referrals and appointments - none  In addition, I have reviewed and discussed with patient certain preventive protocols, quality metrics, and best practice recommendations. A written personalized care plan for preventive services as well as general preventive health recommendations were provided to patient.  See attached scanned questionnaire for additional information.   Signed,  Fabio Neighbors, LPN Nurse Health Advisor   Nurse Recommendations: None.

## 2018-04-08 DIAGNOSIS — M79661 Pain in right lower leg: Secondary | ICD-10-CM | POA: Diagnosis not present

## 2018-04-09 ENCOUNTER — Ambulatory Visit (INDEPENDENT_AMBULATORY_CARE_PROVIDER_SITE_OTHER): Payer: Medicare Other | Admitting: Family Medicine

## 2018-04-09 ENCOUNTER — Encounter: Payer: Self-pay | Admitting: Family Medicine

## 2018-04-09 VITALS — BP 116/72 | HR 59 | Temp 97.6°F | Resp 16 | Ht 74.0 in | Wt 213.0 lb

## 2018-04-09 DIAGNOSIS — Z8673 Personal history of transient ischemic attack (TIA), and cerebral infarction without residual deficits: Secondary | ICD-10-CM

## 2018-04-09 DIAGNOSIS — N401 Enlarged prostate with lower urinary tract symptoms: Secondary | ICD-10-CM | POA: Diagnosis not present

## 2018-04-09 DIAGNOSIS — R351 Nocturia: Secondary | ICD-10-CM | POA: Diagnosis not present

## 2018-04-09 DIAGNOSIS — E785 Hyperlipidemia, unspecified: Secondary | ICD-10-CM | POA: Diagnosis not present

## 2018-04-09 DIAGNOSIS — Z125 Encounter for screening for malignant neoplasm of prostate: Secondary | ICD-10-CM

## 2018-04-09 DIAGNOSIS — R9430 Abnormal result of cardiovascular function study, unspecified: Secondary | ICD-10-CM

## 2018-04-09 DIAGNOSIS — I10 Essential (primary) hypertension: Secondary | ICD-10-CM

## 2018-04-09 MED ORDER — TADALAFIL 5 MG PO TABS: 5.0000 mg | ORAL_TABLET | Freq: Every day | ORAL | 4 refills | Status: DC | PRN

## 2018-04-10 LAB — COMPREHENSIVE METABOLIC PANEL
ALBUMIN: 4.3 g/dL (ref 3.5–4.8)
ALK PHOS: 57 IU/L (ref 39–117)
ALT: 18 IU/L (ref 0–44)
AST: 17 IU/L (ref 0–40)
Albumin/Globulin Ratio: 2 (ref 1.2–2.2)
BILIRUBIN TOTAL: 0.7 mg/dL (ref 0.0–1.2)
BUN / CREAT RATIO: 19 (ref 10–24)
BUN: 17 mg/dL (ref 8–27)
CHLORIDE: 105 mmol/L (ref 96–106)
CO2: 19 mmol/L — ABNORMAL LOW (ref 20–29)
Calcium: 9.1 mg/dL (ref 8.6–10.2)
Creatinine, Ser: 0.89 mg/dL (ref 0.76–1.27)
GFR calc Af Amer: 99 mL/min/{1.73_m2} (ref >59)
GFR calc non Af Amer: 85 mL/min/{1.73_m2} (ref >59)
GLOBULIN, TOTAL: 2.1 g/dL (ref 1.5–4.5)
GLUCOSE: 93 mg/dL (ref 65–99)
POTASSIUM: 4.3 mmol/L (ref 3.5–5.2)
SODIUM: 139 mmol/L (ref 134–144)
Total Protein: 6.4 g/dL (ref 6.0–8.5)

## 2018-04-10 LAB — LIPID PANEL
CHOLESTEROL TOTAL: 118 mg/dL (ref 100–199)
Chol/HDL Ratio: 3.4 ratio (ref 0.0–5.0)
HDL: 35 mg/dL — ABNORMAL LOW (ref >39)
LDL Calculated: 59 mg/dL (ref 0–99)
Triglycerides: 120 mg/dL (ref 0–149)
VLDL Cholesterol Cal: 24 mg/dL (ref 5–40)

## 2018-04-10 LAB — PSA: Prostate Specific Ag, Serum: 1.5 ng/mL (ref 0.0–4.0)

## 2018-04-14 ENCOUNTER — Telehealth: Payer: Self-pay | Admitting: Family Medicine

## 2018-06-22 DIAGNOSIS — Z23 Encounter for immunization: Secondary | ICD-10-CM | POA: Diagnosis not present

## 2018-06-25 DIAGNOSIS — L821 Other seborrheic keratosis: Secondary | ICD-10-CM | POA: Diagnosis not present

## 2018-06-25 DIAGNOSIS — Z85828 Personal history of other malignant neoplasm of skin: Secondary | ICD-10-CM | POA: Diagnosis not present

## 2018-06-25 DIAGNOSIS — D485 Neoplasm of uncertain behavior of skin: Secondary | ICD-10-CM | POA: Diagnosis not present

## 2018-06-25 DIAGNOSIS — L820 Inflamed seborrheic keratosis: Secondary | ICD-10-CM | POA: Diagnosis not present

## 2018-07-03 DIAGNOSIS — Z4801 Encounter for change or removal of surgical wound dressing: Secondary | ICD-10-CM | POA: Diagnosis not present

## 2018-07-03 DIAGNOSIS — H401111 Primary open-angle glaucoma, right eye, mild stage: Secondary | ICD-10-CM | POA: Diagnosis not present

## 2018-07-03 DIAGNOSIS — H401122 Primary open-angle glaucoma, left eye, moderate stage: Secondary | ICD-10-CM | POA: Diagnosis not present

## 2018-07-03 DIAGNOSIS — H44432 Hypotony of eye due to other ocular disorders, left eye: Secondary | ICD-10-CM | POA: Diagnosis not present

## 2018-07-09 DIAGNOSIS — M25512 Pain in left shoulder: Secondary | ICD-10-CM | POA: Diagnosis not present

## 2018-07-09 DIAGNOSIS — M25311 Other instability, right shoulder: Secondary | ICD-10-CM | POA: Diagnosis not present

## 2018-07-09 DIAGNOSIS — M25312 Other instability, left shoulder: Secondary | ICD-10-CM | POA: Diagnosis not present

## 2018-07-09 DIAGNOSIS — M25511 Pain in right shoulder: Secondary | ICD-10-CM | POA: Diagnosis not present

## 2018-07-13 ENCOUNTER — Other Ambulatory Visit: Payer: Self-pay | Admitting: Family Medicine

## 2018-07-13 DIAGNOSIS — N401 Enlarged prostate with lower urinary tract symptoms: Secondary | ICD-10-CM

## 2018-07-13 DIAGNOSIS — R351 Nocturia: Principal | ICD-10-CM

## 2018-07-13 MED ORDER — TADALAFIL 5 MG PO TABS: 5.0000 mg | ORAL_TABLET | Freq: Every day | ORAL | 4 refills | Status: DC | PRN

## 2018-07-13 NOTE — Telephone Encounter (Signed)
Pt needing the refill on: tadalafil (CIALIS) 5 MG tablet  Please fill at: [facility]  [phone]

## 2018-07-16 ENCOUNTER — Other Ambulatory Visit: Payer: Self-pay

## 2018-07-16 DIAGNOSIS — R351 Nocturia: Principal | ICD-10-CM

## 2018-07-16 DIAGNOSIS — N401 Enlarged prostate with lower urinary tract symptoms: Secondary | ICD-10-CM

## 2018-07-16 MED ORDER — TADALAFIL 5 MG PO TABS: 5.0000 mg | ORAL_TABLET | Freq: Every day | ORAL | 4 refills | Status: DC | PRN

## 2018-07-16 NOTE — Telephone Encounter (Signed)
Pt states the RX needs to go to [facility].  I think it may gone to [facility] in [place].   Thanks,   -Mickel Baas

## 2018-07-29 DIAGNOSIS — H4720 Unspecified optic atrophy: Secondary | ICD-10-CM | POA: Diagnosis not present

## 2018-08-17 DIAGNOSIS — R05 Cough: Secondary | ICD-10-CM | POA: Diagnosis not present

## 2018-08-17 DIAGNOSIS — R509 Fever, unspecified: Secondary | ICD-10-CM | POA: Diagnosis not present

## 2018-08-17 DIAGNOSIS — J209 Acute bronchitis, unspecified: Secondary | ICD-10-CM | POA: Diagnosis not present

## 2018-08-17 DIAGNOSIS — J029 Acute pharyngitis, unspecified: Secondary | ICD-10-CM | POA: Diagnosis not present

## 2018-09-17 DIAGNOSIS — D485 Neoplasm of uncertain behavior of skin: Secondary | ICD-10-CM | POA: Diagnosis not present

## 2018-09-17 DIAGNOSIS — L812 Freckles: Secondary | ICD-10-CM | POA: Diagnosis not present

## 2018-09-17 DIAGNOSIS — L821 Other seborrheic keratosis: Secondary | ICD-10-CM | POA: Diagnosis not present

## 2018-09-17 DIAGNOSIS — Z85828 Personal history of other malignant neoplasm of skin: Secondary | ICD-10-CM | POA: Diagnosis not present

## 2018-09-17 DIAGNOSIS — L570 Actinic keratosis: Secondary | ICD-10-CM | POA: Diagnosis not present

## 2018-10-30 DIAGNOSIS — H532 Diplopia: Secondary | ICD-10-CM | POA: Diagnosis not present

## 2018-12-25 DIAGNOSIS — M25511 Pain in right shoulder: Secondary | ICD-10-CM | POA: Diagnosis not present

## 2019-01-20 ENCOUNTER — Ambulatory Visit (INDEPENDENT_AMBULATORY_CARE_PROVIDER_SITE_OTHER): Payer: Medicare Other | Admitting: Family Medicine

## 2019-01-20 ENCOUNTER — Encounter: Payer: Self-pay | Admitting: Family Medicine

## 2019-01-20 ENCOUNTER — Other Ambulatory Visit: Payer: Self-pay

## 2019-01-20 VITALS — BP 98/62 | HR 56 | Temp 98.9°F | Resp 16 | Ht 74.0 in | Wt 207.0 lb

## 2019-01-20 DIAGNOSIS — I10 Essential (primary) hypertension: Secondary | ICD-10-CM | POA: Diagnosis not present

## 2019-01-20 DIAGNOSIS — R351 Nocturia: Secondary | ICD-10-CM | POA: Diagnosis not present

## 2019-01-20 DIAGNOSIS — N401 Enlarged prostate with lower urinary tract symptoms: Secondary | ICD-10-CM

## 2019-01-20 DIAGNOSIS — I952 Hypotension due to drugs: Secondary | ICD-10-CM

## 2019-01-20 MED ORDER — LOSARTAN POTASSIUM 25 MG PO TABS: 25.0000 mg | ORAL_TABLET | Freq: Every day | ORAL | 3 refills | Status: DC

## 2019-01-20 NOTE — Progress Notes (Signed)
Patient: Anthony Higgins Male    DOB: [birth year]   73 y.o.   MRN: [id number] Visit Date: 01/20/2019  Today's Provider: Lelon Huh, MD   Chief Complaint  Patient presents with  . low blood pressure readings   Subjective:     HPI  Hypertension, follow-up:  BP Readings from Last 3 Encounters:  01/20/19 98/62  04/09/18 116/72  04/07/18 130/72    He was last seen for hypertension 9 months ago.  BP at that visit was 116/72. Management since that visit includes no changes. He reports good compliance with treatment. He is having side effects. However, patient reports that he has been getting low blood pressure readings at home. He reports that it averages 110/60s. He reports that he has occasional dizzy spells only lasting a few seconds, but it happens several times a day. Is not taking any new OTC medications, although he was taking OTC potassium for leg cramps for a few months. He also reports he was treated for pneumonia at an Urgent Care in December and January and sx have now completely resolved. Not having any other symptoms.  He is taking daily Cialis for BPH.    Weight trend: stable Wt Readings from Last 3 Encounters:  01/20/19 207 lb (93.9 kg)  04/09/18 213 lb (96.6 kg)  04/07/18 213 lb 3.2 oz (96.7 kg)   Current diet: well balanced   Allergies  Allergen Reactions  . Latex Itching  . Lipitor [Atorvastatin]   . Lisinopril Cough  . Pravachol [Pravastatin Sodium]      Current Outpatient Medications:  .  acetaminophen (TYLENOL) 650 MG CR tablet, Take 650 mg by mouth every 8 (eight) hours as needed for pain., Disp: , Rfl:  .  aspirin 81 MG tablet, Take 81 mg by mouth daily., Disp: , Rfl:  .  cetirizine (ZYRTEC) 10 MG tablet, Take 5 mg by mouth daily. During allergy season, Disp: , Rfl:  .  cholecalciferol (VITAMIN D) 1000 UNITS tablet, Take 1,000 Units by mouth daily., Disp: , Rfl:  .  clopidogrel (PLAVIX) 75 MG tablet, TAKE 1 TABLET BY MOUTH  DAILY, Disp:  90 tablet, Rfl: 4 .  famotidine (PEPCID) 10 MG tablet, Take 10 mg by mouth at bedtime., Disp: , Rfl:  .  glucosamine-chondroitin 500-400 MG tablet, Take 1 tablet by mouth 2 (two) times daily., Disp: , Rfl:  .  losartan (COZAAR) 50 MG tablet, TAKE 1 TABLET EVERY DAY - DOSAGE DECREASED, Disp: 90 tablet, Rfl: 4 .  MELATONIN PO, Take by mouth daily. At night, Disp: , Rfl:  .  Multiple Vitamin (MULTIVITAMIN WITH MINERALS) TABS, Take 1 tablet by mouth daily., Disp: , Rfl:  .  Omega-3 Fatty Acids (FISH OIL) 1200 MG CAPS, Take by mouth 2 (two) times daily., Disp: , Rfl:  .  simvastatin (ZOCOR) 20 MG tablet, TAKE 1 TABLET BY MOUTH AT  BEDTIME, Disp: 90 tablet, Rfl: 4 .  tadalafil (CIALIS) 5 MG tablet, Take 1 tablet (5 mg total) by mouth daily as needed., Disp: 90 tablet, Rfl: 4 .  vitamin B-12 (CYANOCOBALAMIN) 1000 MCG tablet, Take 1,000 mcg by mouth daily., Disp: , Rfl:  .  vitamin C (ASCORBIC ACID) 500 MG tablet, Take 500 mg by mouth daily., Disp: , Rfl:   Review of Systems  Constitutional: Negative for activity change, appetite change, chills, diaphoresis, fatigue, fever and unexpected weight change.  Respiratory: Negative for cough and shortness of breath.   Cardiovascular: Negative for chest  pain, palpitations and leg swelling.  Musculoskeletal: Negative for arthralgias, myalgias, neck pain and neck stiffness.  Allergic/Immunologic: Negative for environmental allergies.  Neurological: Positive for dizziness. Negative for syncope and headaches.    Social History   Tobacco Use  . Smoking status: Former Smoker    Packs/day: 0.00    Years: 7.00    Pack years: 0.00    Types: Cigarettes  . Smokeless tobacco: Never Used  . Tobacco comment: [place]  Substance Use Topics  . Alcohol use: Yes    Alcohol/week: 4.0 standard drinks    Types: 4 Cans of beer per week      Objective:   BP 98/62 (BP Location: Left Arm, Patient Position: Sitting, Cuff Size: Normal)   Pulse (!) 56   Temp  98.9 F (37.2 C)   Resp 16   Ht 6\' 2"  (1.88 m)   Wt 207 lb (93.9 kg)   SpO2 96%   BMI 26.58 kg/m  Vitals:   01/20/19 0818  BP: 98/62  Pulse: (!) 56  Resp: 16  Temp: 98.9 F (37.2 C)  SpO2: 96%  Weight: 207 lb (93.9 kg)  Height: 6\' 2"  (1.88 m)     Physical Exam   General Appearance:    Alert, cooperative, no distress  Eyes:    PERRL, conjunctiva/corneas clear, EOM's intact       Lungs:     Clear to auscultation bilaterally, respirations unlabored  Heart:    Regular rate and rhythm  Neurologic:   Awake, alert, oriented x 3. No apparent focal neurological           defect.           Assessment & Plan     1. Hypotension due to drugs Would like to keep BP on lower end of normal range due to TIA history, but has recently been symptomatic with hypotension. Likely exacerbated by daily Cialis. Will reduce losartan to 25mg  daily. Let me know if sx do not resolve on lower dose.   2. Benign prostatic hyperplasia with nocturia Continue daily Cialis.      Lelon Huh, MD  [facility]

## 2019-01-20 NOTE — Patient Instructions (Addendum)
.   Reduce losartan to 1/2 of a 50mg  tablet daily. If your blood pressure remains mostly below 140 after two weeks, you can start taking a 25mg  losartan tablet daily until your next follow up.

## 2019-01-29 DIAGNOSIS — S46011A Strain of muscle(s) and tendon(s) of the rotator cuff of right shoulder, initial encounter: Secondary | ICD-10-CM | POA: Diagnosis not present

## 2019-01-29 DIAGNOSIS — M25511 Pain in right shoulder: Secondary | ICD-10-CM | POA: Diagnosis not present

## 2019-01-30 DIAGNOSIS — Z03818 Encounter for observation for suspected exposure to other biological agents ruled out: Secondary | ICD-10-CM | POA: Diagnosis not present

## 2019-02-08 DIAGNOSIS — M25511 Pain in right shoulder: Secondary | ICD-10-CM | POA: Diagnosis not present

## 2019-02-10 DIAGNOSIS — H532 Diplopia: Secondary | ICD-10-CM | POA: Diagnosis not present

## 2019-02-10 DIAGNOSIS — Z961 Presence of intraocular lens: Secondary | ICD-10-CM | POA: Diagnosis not present

## 2019-02-10 DIAGNOSIS — H5022 Vertical strabismus, left eye: Secondary | ICD-10-CM | POA: Diagnosis not present

## 2019-02-10 DIAGNOSIS — H401122 Primary open-angle glaucoma, left eye, moderate stage: Secondary | ICD-10-CM | POA: Diagnosis not present

## 2019-02-10 DIAGNOSIS — H401111 Primary open-angle glaucoma, right eye, mild stage: Secondary | ICD-10-CM | POA: Diagnosis not present

## 2019-02-10 DIAGNOSIS — H44432 Hypotony of eye due to other ocular disorders, left eye: Secondary | ICD-10-CM | POA: Diagnosis not present

## 2019-02-10 DIAGNOSIS — H50012 Monocular esotropia, left eye: Secondary | ICD-10-CM | POA: Diagnosis not present

## 2019-02-10 DIAGNOSIS — H04123 Dry eye syndrome of bilateral lacrimal glands: Secondary | ICD-10-CM | POA: Diagnosis not present

## 2019-02-11 DIAGNOSIS — M25511 Pain in right shoulder: Secondary | ICD-10-CM | POA: Diagnosis not present

## 2019-02-15 DIAGNOSIS — M25511 Pain in right shoulder: Secondary | ICD-10-CM | POA: Diagnosis not present

## 2019-02-18 DIAGNOSIS — M25511 Pain in right shoulder: Secondary | ICD-10-CM | POA: Diagnosis not present

## 2019-02-24 DIAGNOSIS — M25511 Pain in right shoulder: Secondary | ICD-10-CM | POA: Diagnosis not present

## 2019-03-01 DIAGNOSIS — M25511 Pain in right shoulder: Secondary | ICD-10-CM | POA: Diagnosis not present

## 2019-03-03 ENCOUNTER — Other Ambulatory Visit: Payer: Self-pay | Admitting: Family Medicine

## 2019-03-03 DIAGNOSIS — I10 Essential (primary) hypertension: Secondary | ICD-10-CM

## 2019-03-03 MED ORDER — LOSARTAN POTASSIUM 25 MG PO TABS: 25.0000 mg | ORAL_TABLET | Freq: Every day | ORAL | 3 refills | Status: DC

## 2019-03-03 NOTE — Telephone Encounter (Signed)
[facility] faxed refill request for the following medications:  losartan (COZAAR) 25 MG tablet    Please advise.

## 2019-03-04 ENCOUNTER — Other Ambulatory Visit: Payer: Self-pay | Admitting: Family Medicine

## 2019-03-04 DIAGNOSIS — M25511 Pain in right shoulder: Secondary | ICD-10-CM | POA: Diagnosis not present

## 2019-03-04 NOTE — Telephone Encounter (Signed)
Pharmacy requesting refills. Please review. Thanks!

## 2019-03-08 DIAGNOSIS — M25511 Pain in right shoulder: Secondary | ICD-10-CM | POA: Diagnosis not present

## 2019-03-11 DIAGNOSIS — M25511 Pain in right shoulder: Secondary | ICD-10-CM | POA: Diagnosis not present

## 2019-03-15 DIAGNOSIS — M25511 Pain in right shoulder: Secondary | ICD-10-CM | POA: Diagnosis not present

## 2019-03-15 DIAGNOSIS — M7541 Impingement syndrome of right shoulder: Secondary | ICD-10-CM | POA: Diagnosis not present

## 2019-03-18 DIAGNOSIS — L821 Other seborrheic keratosis: Secondary | ICD-10-CM | POA: Diagnosis not present

## 2019-03-18 DIAGNOSIS — L812 Freckles: Secondary | ICD-10-CM | POA: Diagnosis not present

## 2019-03-18 DIAGNOSIS — I788 Other diseases of capillaries: Secondary | ICD-10-CM | POA: Diagnosis not present

## 2019-03-18 DIAGNOSIS — L578 Other skin changes due to chronic exposure to nonionizing radiation: Secondary | ICD-10-CM | POA: Diagnosis not present

## 2019-03-18 DIAGNOSIS — L570 Actinic keratosis: Secondary | ICD-10-CM | POA: Diagnosis not present

## 2019-03-18 DIAGNOSIS — D1801 Hemangioma of skin and subcutaneous tissue: Secondary | ICD-10-CM | POA: Diagnosis not present

## 2019-03-18 DIAGNOSIS — Z85828 Personal history of other malignant neoplasm of skin: Secondary | ICD-10-CM | POA: Diagnosis not present

## 2019-03-18 DIAGNOSIS — M25511 Pain in right shoulder: Secondary | ICD-10-CM | POA: Diagnosis not present

## 2019-03-22 DIAGNOSIS — M25511 Pain in right shoulder: Secondary | ICD-10-CM | POA: Diagnosis not present

## 2019-03-25 DIAGNOSIS — M25511 Pain in right shoulder: Secondary | ICD-10-CM | POA: Diagnosis not present

## 2019-04-12 NOTE — Progress Notes (Signed)
Subjective:   Anthony Higgins is a 73 y.o. male who presents for Medicare Annual/Subsequent preventive examination.    This visit is being conducted through telemedicine due to the COVID-19 pandemic. This patient has given me verbal consent via doximity to conduct this visit, patient states they are participating from their home address. Some vital signs may be absent or patient reported.    Patient identification: identified by name, DOB, and current address  Review of Systems:  N/A  Cardiac Risk Factors include: advanced age (>25men, >41 women);dyslipidemia;hypertension;male gender;sedentary lifestyle     Objective:    Vitals: There were no vitals taken for this visit.  There is no height or weight on file to calculate BMI. Unable to obtain vitals due to visit being conducted via telephonically.   Advanced Directives 04/13/2019 04/07/2018  Does Patient Have a Medical Advance Directive? No No  Would patient like information on creating a medical advance directive? No - Patient declined No - Patient declined    Tobacco Social History   Tobacco Use  Smoking Status Former Smoker  . Packs/day: 0.00  . Years: 7.00  . Pack years: 0.00  . Types: Cigarettes  Smokeless Tobacco Never Used  Tobacco Comment   QUIT SMOKING 1975     Counseling given: Not Answered Comment: QUIT SMOKING 1975   Clinical Intake:  Pre-visit preparation completed: Yes  Pain : No/denies pain Pain Score: 0-No pain     Nutritional Risks: None Diabetes: No  How often do you need to have someone help you when you read instructions, pamphlets, or other written materials from your doctor or pharmacy?: 1 - Never  Interpreter Needed?: No  Information entered by :: [facility], LPN  Past Medical History:  Diagnosis Date  . Anemia   . Basal cell carcinoma    basel cell skin cancer  . Dislocated shoulder   . History of chicken pox 09/17/2016   DID have Chicken Pox. DID have Measles.   .  Hyperlipidemia   . Hypertension    Past Surgical History:  Procedure Laterality Date  . ADENOIDECTOMY    . BACK SURGERY    . bone graph    . cataract surgery    . COLONOSCOPY    . CYST REMOVAL LEG Left   . GLAUCOMA SURGERY    . HAND SURGERY    . TONSILLECTOMY     Family History  Problem Relation Age of Onset  . Heart disease Sister   . Dementia Sister    Social History   Socioeconomic History  . Marital status: Divorced    Spouse name: Not on file  . Number of children: 2  . Years of education: College  . Highest education level: Bachelor's degree (e.g., BA, AB, BS)  Occupational History  . Occupation: retired    Fish farm manager: [employer]  . Financial resource strain: Not hard at all  . Food insecurity    Worry: Never true    Inability: Never true  . Transportation needs    Medical: No    Non-medical: No  Tobacco Use  . Smoking status: Former Smoker    Packs/day: 0.00    Years: 7.00    Pack years: 0.00    Types: Cigarettes  . Smokeless tobacco: Never Used  . Tobacco comment: [place]  Substance and Sexual Activity  . Alcohol use: Yes    Alcohol/week: 14.0 standard drinks    Types: 14 Cans of beer per week  .  Drug use: No  . Sexual activity: Not on file  Lifestyle  . Physical activity    Days per week: 0 days    Minutes per session: 0 min  . Stress: Not at all  Relationships  . Social Herbalist on phone: Patient refused    Gets together: Patient refused    Attends religious service: Patient refused    Active member of club or organization: Patient refused    Attends meetings of clubs or organizations: Patient refused    Relationship status: Patient refused  Other Topics Concern  . Not on file  Social History Narrative  . Not on file    Outpatient Encounter Medications as of 04/13/2019  Medication Sig  . acetaminophen (TYLENOL) 650 MG CR tablet Take 650 mg by mouth every 8 (eight) hours as needed for pain.   Marland Kitchen aspirin 81 MG tablet Take 81 mg by mouth daily.  . cholecalciferol (VITAMIN D) 1000 UNITS tablet Take 500 Units by mouth daily.   . clopidogrel (PLAVIX) 75 MG tablet TAKE 1 TABLET BY MOUTH  DAILY  . famotidine (PEPCID) 10 MG tablet Take 10 mg by mouth at bedtime.  Marland Kitchen glucosamine-chondroitin 500-400 MG tablet Take 1 tablet by mouth 2 (two) times daily.  Marland Kitchen losartan (COZAAR) 25 MG tablet Take 1 tablet (25 mg total) by mouth daily.  Marland Kitchen MELATONIN PO Take by mouth as needed. At night  . Multiple Vitamin (MULTIVITAMIN WITH MINERALS) TABS Take 1 tablet by mouth daily.  . Omega-3 Fatty Acids (FISH OIL) 1200 MG CAPS Take by mouth 2 (two) times daily.  . simvastatin (ZOCOR) 20 MG tablet TAKE 1 TABLET BY MOUTH AT  BEDTIME  . tadalafil (CIALIS) 5 MG tablet Take 1 tablet (5 mg total) by mouth daily as needed.  . vitamin B-12 (CYANOCOBALAMIN) 1000 MCG tablet Take 1,000 mcg by mouth daily.  . vitamin C (ASCORBIC ACID) 500 MG tablet Take 500 mg by mouth daily.  . cetirizine (ZYRTEC) 10 MG tablet Take 5 mg by mouth daily. During allergy season   No facility-administered encounter medications on file as of 04/13/2019.     Activities of Daily Living In your present state of health, do you have any difficulty performing the following activities: 04/13/2019  Hearing? N  Vision? Y  Comment Due to loss of vision in the left eye.  Difficulty concentrating or making decisions? N  Walking or climbing stairs? N  Dressing or bathing? N  Doing errands, shopping? N  Preparing Food and eating ? N  Using the Toilet? N  In the past six months, have you accidently leaked urine? N  Do you have problems with loss of bowel control? N  Managing your Medications? N  Managing your Finances? N  Housekeeping or managing your Housekeeping? N  Some recent data might be hidden    Patient Care Team: Birdie Sons, MD as PCP - General (Family Medicine) Evangeline Gula, MD as Referring Physician (Ophthalmology)  Jarome Matin, MD as Consulting Physician (Dermatology)   Assessment:   This is a routine wellness examination for Anthony Higgins.  Exercise Activities and Dietary recommendations Current Exercise Habits: Home exercise routine, Type of exercise: stretching;walking(plays tennis), Time (Minutes): 45, Frequency (Times/Week): 7, Weekly Exercise (Minutes/Week): 315, Intensity: Mild, Exercise limited by: None identified  Goals    . DIET - REDUCE SODIUM INTAKE     Recommend cutting back on eating prepackaged dinners to cut back on sodium intake.     Marland Kitchen  LIFESTYLE - DECREASE FALLS RISK     Recommend to remove any items from the home that may cause slips or trips.       Fall Risk: Fall Risk  04/13/2019 04/07/2018 03/10/2017  Falls in the past year? 1 No No  Number falls in past yr: 1 - -  Injury with Fall? 0 - -  Follow up Falls prevention discussed - -    FALL RISK PREVENTION PERTAINING TO THE HOME:  Any stairs in or around the home? Yes  If so, are there any without handrails? No   Home free of loose throw rugs in walkways, pet beds, electrical cords, etc? Yes  Adequate lighting in your home to reduce risk of falls? Yes   ASSISTIVE DEVICES UTILIZED TO PREVENT FALLS:  Life alert? No  Use of a cane, walker or w/c? No  Grab bars in the bathroom? No  Shower chair or bench in shower? No  Elevated toilet seat or a handicapped toilet? No   TIMED UP AND GO:  Was the test performed? No .    Depression Screen PHQ 2/9 Scores 04/13/2019 04/07/2018 04/07/2018 03/10/2017  PHQ - 2 Score 0 0 0 0  PHQ- 9 Score - 0 - 0    Cognitive Function: Declined today.         Immunization History  Administered Date(s) Administered  . Influenza, High Dose Seasonal PF 06/22/2018  . Influenza,inj,quad, With Preservative 05/26/2017  . Influenza-Unspecified 04/26/2013, 04/29/2016  . Pneumococcal Conjugate-13 02/24/2016, 04/04/2017  . Pneumococcal Polysaccharide-23 09/30/2011  . Tdap 09/30/2011  . Zoster  02/01/2010  . Zoster Recombinat (Shingrix) 08/28/2018, 10/28/2018    Qualifies for Shingles Vaccine? Completed series.    Tdap: Up to date  Flu Vaccine: Due fall 2020  Pneumococcal Vaccine: Completed series  Screening Tests Health Maintenance  Topic Date Due  . INFLUENZA VACCINE  03/27/2019  . Fecal DNA (Cologuard)  10/09/2019  . TETANUS/TDAP  09/29/2021  . Hepatitis C Screening  Completed  . PNA vac Low Risk Adult  Completed   Cancer Screenings:  Colorectal Screening: Cologuard completed 10/08/16. Repeat every 3 years.  Lung Cancer Screening: (Low Dose CT Chest recommended if Age 40-80 years, 30 pack-year currently smoking OR have quit w/in 15years.) does not qualify.   Additional Screening:  Hepatitis C Screening: Up to date  Vision Screening: Recommended annual ophthalmology exams for early detection of glaucoma and other disorders of the eye.  Dental Screening: Recommended annual dental exams for proper oral hygiene  Community Resource Referral:  CRR required this visit?  No        Plan:  I have personally reviewed and addressed the Medicare Annual Wellness questionnaire and have noted the following in the patient's chart:  A. Medical and social history B. Use of alcohol, tobacco or illicit drugs  C. Current medications and supplements D. Functional ability and status E.  Nutritional status F.  Physical activity G. Advance directives H. List of other physicians I.  Hospitalizations, surgeries, and ER visits in previous 12 months J.  [place] such as hearing and vision if needed, cognitive and depression L. Referrals and appointments   In addition, I have reviewed and discussed with patient certain preventive protocols, quality metrics, and best practice recommendations. A written personalized care plan for preventive services as well as general preventive health recommendations were provided to patient.   Glendora Score, [place]   02/22/4764 Nurse Health Advisor   Nurse Notes: Flu vaccine due  this fall.

## 2019-04-13 ENCOUNTER — Other Ambulatory Visit: Payer: Self-pay

## 2019-04-13 ENCOUNTER — Ambulatory Visit (INDEPENDENT_AMBULATORY_CARE_PROVIDER_SITE_OTHER): Payer: Medicare Other

## 2019-04-13 DIAGNOSIS — Z0000 Encounter for general adult medical examination without abnormal findings: Secondary | ICD-10-CM

## 2019-04-13 NOTE — Patient Instructions (Addendum)
Anthony Higgins , Thank you for taking time to come for your Medicare Wellness Visit. I appreciate your ongoing commitment to your health goals. Please review the following plan we discussed and let me know if I can assist you in the future.   Screening recommendations/referrals: Colonoscopy: Cologuard up to date, due 10/09/19 Recommended yearly ophthalmology/optometry visit for glaucoma screening and checkup Recommended yearly dental visit for hygiene and checkup  Vaccinations: Influenza vaccine: Due fall 2020 Pneumococcal vaccine: Completed series Tdap vaccine: Up to date, due 09/2021 Shingles vaccine: Completed series    Advanced directives: Please bring a copy of your POA (Power of [place]) and/or Living Will once completed.   Conditions/risks identified: Fall risk prevention discussed today. Continue to cut back on sodium intake and avoid packaged dinners.   Next appointment: 05/18/19 @ 10:00 AM with Dr Caryn Section. Declined scheduling an AWV for 2021 at this time.   Preventive Care 41 Years and Older, Male Preventive care refers to lifestyle choices and visits with your health care provider that can promote health and wellness. What does preventive care include?  A yearly physical exam. This is also called an annual well check.  Dental exams once or twice a year.  Routine eye exams. Ask your health care provider how often you should have your eyes checked.  Personal lifestyle choices, including:  Daily care of your teeth and gums.  Regular physical activity.  Eating a healthy diet.  Avoiding tobacco and drug use.  Limiting alcohol use.  Practicing safe sex.  Taking low doses of aspirin every day.  Taking vitamin and mineral supplements as recommended by your health care provider. What happens during an annual well check? The services and screenings done by your health care provider during your annual well check will depend on your age, overall health, lifestyle risk  factors, and family history of disease. Counseling  Your health care provider may ask you questions about your:  Alcohol use.  Tobacco use.  Drug use.  Emotional well-being.  Home and relationship well-being.  Sexual activity.  Eating habits.  History of falls.  Memory and ability to understand (cognition).  Work and work Statistician. Screening  You may have the following tests or measurements:  Height, weight, and BMI.  Blood pressure.  Lipid and cholesterol levels. These may be checked every 5 years, or more frequently if you are over 48 years old.  Skin check.  Lung cancer screening. You may have this screening every year starting at age 18 if you have a 30-pack-year history of smoking and currently smoke or have quit within the past 15 years.  Fecal occult blood test (FOBT) of the stool. You may have this test every year starting at age 2.  Flexible sigmoidoscopy or colonoscopy. You may have a sigmoidoscopy every 5 years or a colonoscopy every 10 years starting at age 8.  Prostate cancer screening. Recommendations will vary depending on your family history and other risks.  Hepatitis C blood test.  Hepatitis B blood test.  Sexually transmitted disease (STD) testing.  Diabetes screening. This is done by checking your blood sugar (glucose) after you have not eaten for a while (fasting). You may have this done every 1-3 years.  Abdominal aortic aneurysm (AAA) screening. You may need this if you are a current or former smoker.  Osteoporosis. You may be screened starting at age 39 if you are at high risk. Talk with your health care provider about your test results, treatment options, and if necessary, the  need for more tests. Vaccines  Your health care provider may recommend certain vaccines, such as:  Influenza vaccine. This is recommended every year.  Tetanus, diphtheria, and acellular pertussis (Tdap, Td) vaccine. You may need a Td booster every 10  years.  Zoster vaccine. You may need this after age 60.  Pneumococcal 13-valent conjugate (PCV13) vaccine. One dose is recommended after age 61.  Pneumococcal polysaccharide (PPSV23) vaccine. One dose is recommended after age 12. Talk to your health care provider about which screenings and vaccines you need and how often you need them. This information is not intended to replace advice given to you by your health care provider. Make sure you discuss any questions you have with your health care provider. Document Released: 09/08/2015 Document Revised: 05/01/2016 Document Reviewed: 06/13/2015 Elsevier Interactive Patient Education  2017 [place] Prevention in the Home Falls can cause injuries. They can happen to people of all ages. There are many things you can do to make your home safe and to help prevent falls. What can I do on the outside of my home?  Regularly fix the edges of walkways and driveways and fix any cracks.  Remove anything that might make you trip as you walk through a door, such as a raised step or threshold.  Trim any bushes or trees on the path to your home.  Use bright outdoor lighting.  Clear any walking paths of anything that might make someone trip, such as rocks or tools.  Regularly check to see if handrails are loose or broken. Make sure that both sides of any steps have handrails.  Any raised decks and porches should have guardrails on the edges.  Have any leaves, snow, or ice cleared regularly.  Use sand or salt on walking paths during winter.  Clean up any spills in your garage right away. This includes oil or grease spills. What can I do in the bathroom?  Use night lights.  Install grab bars by the toilet and in the tub and shower. Do not use towel bars as grab bars.  Use non-skid mats or decals in the tub or shower.  If you need to sit down in the shower, use a plastic, non-slip stool.  Keep the floor dry. Clean up any water that  spills on the floor as soon as it happens.  Remove soap buildup in the tub or shower regularly.  Attach bath mats securely with double-sided non-slip rug tape.  Do not have throw rugs and other things on the floor that can make you trip. What can I do in the bedroom?  Use night lights.  Make sure that you have a light by your bed that is easy to reach.  Do not use any sheets or blankets that are too big for your bed. They should not hang down onto the floor.  Have a firm chair that has side arms. You can use this for support while you get dressed.  Do not have throw rugs and other things on the floor that can make you trip. What can I do in the kitchen?  Clean up any spills right away.  Avoid walking on wet floors.  Keep items that you use a lot in easy-to-reach places.  If you need to reach something above you, use a strong step stool that has a grab bar.  Keep electrical cords out of the way.  Do not use floor polish or wax that makes floors slippery. If you must use wax,  use non-skid floor wax.  Do not have throw rugs and other things on the floor that can make you trip. What can I do with my stairs?  Do not leave any items on the stairs.  Make sure that there are handrails on both sides of the stairs and use them. Fix handrails that are broken or loose. Make sure that handrails are as long as the stairways.  Check any carpeting to make sure that it is firmly attached to the stairs. Fix any carpet that is loose or worn.  Avoid having throw rugs at the top or bottom of the stairs. If you do have throw rugs, attach them to the floor with carpet tape.  Make sure that you have a light switch at the top of the stairs and the bottom of the stairs. If you do not have them, ask someone to add them for you. What else can I do to help prevent falls?  Wear shoes that:  Do not have high heels.  Have rubber bottoms.  Are comfortable and fit you well.  Are closed at the  toe. Do not wear sandals.  If you use a stepladder:  Make sure that it is fully opened. Do not climb a closed stepladder.  Make sure that both sides of the stepladder are locked into place.  Ask someone to hold it for you, if possible.  Clearly mark and make sure that you can see:  Any grab bars or handrails.  First and last steps.  Where the edge of each step is.  Use tools that help you move around (mobility aids) if they are needed. These include:  Canes.  Walkers.  Scooters.  Crutches.  Turn on the lights when you go into a dark area. Replace any light bulbs as soon as they burn out.  Set up your furniture so you have a clear path. Avoid moving your furniture around.  If any of your floors are uneven, fix them.  If there are any pets around you, be aware of where they are.  Review your medicines with your doctor. Some medicines can make you feel dizzy. This can increase your chance of falling. Ask your doctor what other things that you can do to help prevent falls. This information is not intended to replace advice given to you by your health care provider. Make sure you discuss any questions you have with your health care provider. Document Released: 06/08/2009 Document Revised: 01/18/2016 Document Reviewed: 09/16/2014 Elsevier Interactive Patient Education  2017 [employer].

## 2019-04-15 ENCOUNTER — Encounter: Payer: Medicare Other | Admitting: Family Medicine

## 2019-04-20 ENCOUNTER — Telehealth: Payer: Self-pay

## 2019-04-20 DIAGNOSIS — N529 Male erectile dysfunction, unspecified: Secondary | ICD-10-CM

## 2019-04-20 MED ORDER — SILDENAFIL CITRATE 100 MG PO TABS: 50.0000 mg | ORAL_TABLET | Freq: Every day | ORAL | 11 refills | Status: AC | PRN

## 2019-04-20 NOTE — Telephone Encounter (Signed)
Patient called requesting a prescription for generic Viagra (Sildenafil). L.O.V. was 01/20/2019, please advise.

## 2019-05-07 DIAGNOSIS — Z23 Encounter for immunization: Secondary | ICD-10-CM | POA: Diagnosis not present

## 2019-05-14 DIAGNOSIS — H04123 Dry eye syndrome of bilateral lacrimal glands: Secondary | ICD-10-CM | POA: Diagnosis not present

## 2019-05-14 DIAGNOSIS — H532 Diplopia: Secondary | ICD-10-CM | POA: Diagnosis not present

## 2019-05-14 DIAGNOSIS — H401122 Primary open-angle glaucoma, left eye, moderate stage: Secondary | ICD-10-CM | POA: Diagnosis not present

## 2019-05-14 DIAGNOSIS — Z961 Presence of intraocular lens: Secondary | ICD-10-CM | POA: Diagnosis not present

## 2019-05-18 ENCOUNTER — Ambulatory Visit (INDEPENDENT_AMBULATORY_CARE_PROVIDER_SITE_OTHER): Payer: Medicare Other | Admitting: Family Medicine

## 2019-05-18 ENCOUNTER — Encounter: Payer: Self-pay | Admitting: Family Medicine

## 2019-05-18 ENCOUNTER — Other Ambulatory Visit: Payer: Self-pay

## 2019-05-18 VITALS — BP 108/60 | HR 47 | Temp 96.9°F | Resp 16 | Ht 74.0 in | Wt 205.0 lb

## 2019-05-18 DIAGNOSIS — E785 Hyperlipidemia, unspecified: Secondary | ICD-10-CM | POA: Diagnosis not present

## 2019-05-18 DIAGNOSIS — K219 Gastro-esophageal reflux disease without esophagitis: Secondary | ICD-10-CM | POA: Diagnosis not present

## 2019-05-18 DIAGNOSIS — I10 Essential (primary) hypertension: Secondary | ICD-10-CM

## 2019-05-18 DIAGNOSIS — N401 Enlarged prostate with lower urinary tract symptoms: Secondary | ICD-10-CM

## 2019-05-18 DIAGNOSIS — R3914 Feeling of incomplete bladder emptying: Secondary | ICD-10-CM | POA: Diagnosis not present

## 2019-05-18 DIAGNOSIS — Z125 Encounter for screening for malignant neoplasm of prostate: Secondary | ICD-10-CM

## 2019-05-18 DIAGNOSIS — N529 Male erectile dysfunction, unspecified: Secondary | ICD-10-CM

## 2019-05-18 DIAGNOSIS — R350 Frequency of micturition: Secondary | ICD-10-CM | POA: Diagnosis not present

## 2019-05-18 NOTE — Progress Notes (Signed)
Patient: Anthony Higgins Male    DOB: 1945-12-16   73 y.o.   MRN: [id number] Visit Date: 05/18/2019  Today's Provider: Lelon Huh, MD   Chief Complaint  Patient presents with  . Follow-up   Subjective:     HPI  Follow up for Hypotension:  The patient was last seen for this 4 months ago. Changes made at last visit include reducing Losartan to 25mg  daily and he reports he has not had any light headedness or weakness since. He does have TIA history and goal is to keep BP as low as tolerated on ARB  He reports good compliance with treatment. He feels that condition is Improved. He is not having side effects.   ------------------------------------------------------------------------------------   Follow up for BPH:  The patient was last seen for this 4 months ago. Changes made at last visit include none; patient advised to continue daily Cialis.  He reports fair compliance with treatment. Patient is currently taking Viagra instead of Cialis. He feels that condition is Worse. He has to get up several times through out the night and only voids a small amount of urine at night and through the day with urinary hesitancy hesitancy.   He is not having side effects. He is also very concerned about ED as he now has a serious girlfriend and has a great deal of difficulty maintaining adequate erection for intercourse. She currently live in [place] and he is planning on moving to [place] in February.    ------------------------------------------------------------------------------------  Allergies  Allergen Reactions  . Latex Itching  . Lipitor [Atorvastatin]   . Lisinopril Cough  . Pravachol [Pravastatin Sodium]      Current Outpatient Medications:  .  acetaminophen (TYLENOL) 650 MG CR tablet, Take 650 mg by mouth every 8 (eight) hours as needed for pain., Disp: , Rfl:  .  aspirin 81 MG tablet, Take 81 mg by mouth daily., Disp: , Rfl:  .  cetirizine (ZYRTEC) 10 MG  tablet, Take 5 mg by mouth daily. During allergy season, Disp: , Rfl:  .  cholecalciferol (VITAMIN D) 1000 UNITS tablet, Take 500 Units by mouth daily. , Disp: , Rfl:  .  clopidogrel (PLAVIX) 75 MG tablet, TAKE 1 TABLET BY MOUTH  DAILY, Disp: 90 tablet, Rfl: 4 .  famotidine (PEPCID) 10 MG tablet, Take 10 mg by mouth at bedtime., Disp: , Rfl:  .  glucosamine-chondroitin 500-400 MG tablet, Take 1 tablet by mouth 2 (two) times daily., Disp: , Rfl:  .  losartan (COZAAR) 25 MG tablet, Take 1 tablet (25 mg total) by mouth daily., Disp: 90 tablet, Rfl: 3 .  MELATONIN PO, Take by mouth as needed. At night, Disp: , Rfl:  .  Multiple Vitamin (MULTIVITAMIN WITH MINERALS) TABS, Take 1 tablet by mouth daily., Disp: , Rfl:  .  Omega-3 Fatty Acids (FISH OIL) 1200 MG CAPS, Take by mouth 2 (two) times daily., Disp: , Rfl:  .  sildenafil (VIAGRA) 100 MG tablet, Take 0.5-1 tablets (50-100 mg total) by mouth daily as needed for erectile dysfunction., Disp: 8 tablet, Rfl: 11 .  simvastatin (ZOCOR) 20 MG tablet, TAKE 1 TABLET BY MOUTH AT  BEDTIME, Disp: 90 tablet, Rfl: 4 .  vitamin B-12 (CYANOCOBALAMIN) 1000 MCG tablet, Take 1,000 mcg by mouth daily., Disp: , Rfl:  .  vitamin C (ASCORBIC ACID) 500 MG tablet, Take 500 mg by mouth daily., Disp: , Rfl:   Review of Systems  Constitutional: Negative for appetite change,  chills, fatigue and fever.  HENT: Negative for congestion, ear pain, hearing loss, nosebleeds and trouble swallowing.   Eyes: Negative for pain and visual disturbance.  Respiratory: Negative for cough, chest tightness and shortness of breath.   Cardiovascular: Negative for chest pain, palpitations and leg swelling.  Gastrointestinal: Negative for abdominal pain, blood in stool, constipation, diarrhea, nausea and vomiting.  Endocrine: Negative for polydipsia, polyphagia and polyuria.  Genitourinary: Positive for frequency. Negative for dysuria and flank pain.       ED  Musculoskeletal: Negative for  arthralgias, back pain, joint swelling, myalgias and neck stiffness.  Skin: Negative for color change, rash and wound.  Neurological: Negative for dizziness, tremors, seizures, speech difficulty, weakness, light-headedness and headaches.  Psychiatric/Behavioral: Negative for behavioral problems, confusion, decreased concentration, dysphoric mood and sleep disturbance. The patient is not nervous/anxious.   All other systems reviewed and are negative.   Social History   Tobacco Use  . Smoking status: Former Smoker    Packs/day: 0.00    Years: 7.00    Pack years: 0.00    Types: Cigarettes  . Smokeless tobacco: Never Used  . Tobacco comment: [place]  Substance Use Topics  . Alcohol use: Yes    Alcohol/week: 14.0 standard drinks    Types: 14 Cans of beer per week      Objective:   BP 108/60 (BP Location: Right Arm, Patient Position: Sitting, Cuff Size: Large)   Pulse (!) 47   Temp (!) 96.9 F (36.1 C) (Temporal)   Resp 16   Ht 6\' 2"  (1.88 m)   Wt 205 lb (93 kg)   SpO2 98% Comment: room air  BMI 26.32 kg/m  Vitals:   05/18/19 1000  BP: 108/60  Pulse: (!) 47  Resp: 16  Temp: (!) 96.9 F (36.1 C)  TempSrc: Temporal  SpO2: 98%  Weight: 205 lb (93 kg)  Height: 6\' 2"  (1.88 m)  Body mass index is 26.32 kg/m.   Physical Exam    General Appearance:    Alert, cooperative, no distress  Eyes:    PERRL, conjunctiva/corneas clear, EOM's intact       Lungs:     Clear to auscultation bilaterally, respirations unlabored  Heart:    Bradycardic. Normal rhythm. No murmurs, rubs, or gallops.   MS:   All extremities are intact.   Neurologic:   Awake, alert, oriented x 3. No apparent focal neurological           defect.   GU:    3+ enlarged prostate, symmetric, no nodule       Assessment & Plan    1. Essential hypertension Well controlled.  Continue current medications.   - TSH  2. Hyperlipidemia, unspecified hyperlipidemia type He is tolerating simvastatin well  with no adverse effects.   - Comprehensive metabolic panel - Lipid panel  3. Gastroesophageal reflux disease, esophagitis presence not specified Well controlled on famotidine.   4. ED (erectile dysfunction) of organic origin  - Testosterone,Free and Total - TSH  5. Prostate cancer screening  - PSA  6. Urinary frequency  7. Benign prostatic hyperplasia with incomplete bladder emptying     Lelon Huh, MD  [facility]

## 2019-05-19 ENCOUNTER — Other Ambulatory Visit: Payer: Self-pay | Admitting: Family Medicine

## 2019-05-19 DIAGNOSIS — R3915 Urgency of urination: Secondary | ICD-10-CM

## 2019-05-19 DIAGNOSIS — R3911 Hesitancy of micturition: Secondary | ICD-10-CM

## 2019-05-19 DIAGNOSIS — N529 Male erectile dysfunction, unspecified: Secondary | ICD-10-CM

## 2019-05-21 ENCOUNTER — Telehealth: Payer: Self-pay

## 2019-05-21 LAB — COMPREHENSIVE METABOLIC PANEL
ALT: 32 IU/L (ref 0–44)
AST: 24 IU/L (ref 0–40)
Albumin/Globulin Ratio: 1.8 (ref 1.2–2.2)
Albumin: 4.4 g/dL (ref 3.7–4.7)
Alkaline Phosphatase: 65 IU/L (ref 39–117)
BUN/Creatinine Ratio: 17 (ref 10–24)
BUN: 17 mg/dL (ref 8–27)
Bilirubin Total: 0.3 mg/dL (ref 0.0–1.2)
CO2: 23 mmol/L (ref 20–29)
Calcium: 9.6 mg/dL (ref 8.6–10.2)
Chloride: 105 mmol/L (ref 96–106)
Creatinine, Ser: 1.03 mg/dL (ref 0.76–1.27)
GFR calc Af Amer: 83 mL/min/{1.73_m2} (ref >59)
GFR calc non Af Amer: 72 mL/min/{1.73_m2} (ref >59)
Globulin, Total: 2.4 g/dL (ref 1.5–4.5)
Glucose: 103 mg/dL — ABNORMAL HIGH (ref 65–99)
Potassium: 4.4 mmol/L (ref 3.5–5.2)
Sodium: 139 mmol/L (ref 134–144)
Total Protein: 6.8 g/dL (ref 6.0–8.5)

## 2019-05-21 LAB — PSA: Prostate Specific Ag, Serum: 1.1 ng/mL (ref 0.0–4.0)

## 2019-05-21 LAB — LIPID PANEL
Chol/HDL Ratio: 2.6 ratio (ref 0.0–5.0)
Cholesterol, Total: 128 mg/dL (ref 100–199)
HDL: 49 mg/dL (ref >39)
LDL Chol Calc (NIH): 68 mg/dL (ref 0–99)
Triglycerides: 48 mg/dL (ref 0–149)
VLDL Cholesterol Cal: 11 mg/dL (ref 5–40)

## 2019-05-21 LAB — TESTOSTERONE,FREE AND TOTAL
Testosterone, Free: 6.1 pg/mL — ABNORMAL LOW (ref 6.6–18.1)
Testosterone: 478 ng/dL (ref 264–916)

## 2019-05-21 LAB — TSH: TSH: 1.75 u[IU]/mL (ref 0.450–4.500)

## 2019-05-21 NOTE — Telephone Encounter (Signed)
Patient called and states that [facility] in [place] could not get him in the office until late October and he will be getting married around the date and time they had available. Patient is wanting to know if he could be referred to the [facility] in [place] before late October.Please advise.

## 2019-05-21 NOTE — Telephone Encounter (Signed)
Can you see if he can get into [facility] in [place] sooner. Thanks.

## 2019-05-23 NOTE — Patient Instructions (Signed)
.   Please review the attached list of medications and notify my office if there are any errors.   . Please bring all of your medications to every appointment so we can make sure that our medication list is the same as yours.   . It is especially important to get the annual flu vaccine this year. If you haven't had it already, please go to your pharmacy or call the office as soon as possible to schedule you flu shot.  

## 2019-05-31 ENCOUNTER — Telehealth: Payer: Self-pay

## 2019-05-31 NOTE — Telephone Encounter (Signed)
Pt stated that he is getting married in 3 weeks and the referral for Urology that was put in but, no one can see him before his wedding. Pt stated that he would like to speak to a CMA to discuss if he could possibly get a shot in the office to help with his issue of having to urinate more than 4 times during the night and also help with testosterone. Please advise. Thanks TNP

## 2019-06-01 NOTE — Telephone Encounter (Signed)
Patient was advised he states that he needs something for his testosterone because he is having difficulty getting and keeping and erection and does not want to run into issues when he gets married. KW

## 2019-06-01 NOTE — Telephone Encounter (Signed)
There's not any shots for that, but we could prescribe Avodart while he is waiting to get in with urologist. It usually makes prostate smaller so you don't have to urinate so frequently. Testosterone shots usually make it worse.  If he likes can send in Avodart 0.5mg  daily #30, rf x 3

## 2019-06-16 DIAGNOSIS — L111 Transient acantholytic dermatosis [Grover]: Secondary | ICD-10-CM | POA: Diagnosis not present

## 2019-06-16 DIAGNOSIS — L910 Hypertrophic scar: Secondary | ICD-10-CM | POA: Diagnosis not present

## 2019-06-16 DIAGNOSIS — L812 Freckles: Secondary | ICD-10-CM | POA: Diagnosis not present

## 2019-06-16 DIAGNOSIS — L570 Actinic keratosis: Secondary | ICD-10-CM | POA: Diagnosis not present

## 2019-06-16 DIAGNOSIS — L820 Inflamed seborrheic keratosis: Secondary | ICD-10-CM | POA: Diagnosis not present

## 2019-06-16 DIAGNOSIS — Z85828 Personal history of other malignant neoplasm of skin: Secondary | ICD-10-CM | POA: Diagnosis not present

## 2019-06-16 DIAGNOSIS — D1801 Hemangioma of skin and subcutaneous tissue: Secondary | ICD-10-CM | POA: Diagnosis not present

## 2019-06-16 DIAGNOSIS — L720 Epidermal cyst: Secondary | ICD-10-CM | POA: Diagnosis not present

## 2019-06-16 DIAGNOSIS — L821 Other seborrheic keratosis: Secondary | ICD-10-CM | POA: Diagnosis not present

## 2019-09-16 DIAGNOSIS — L729 Follicular cyst of the skin and subcutaneous tissue, unspecified: Secondary | ICD-10-CM | POA: Diagnosis not present

## 2019-09-16 DIAGNOSIS — D692 Other nonthrombocytopenic purpura: Secondary | ICD-10-CM | POA: Diagnosis not present

## 2019-09-16 DIAGNOSIS — Z85828 Personal history of other malignant neoplasm of skin: Secondary | ICD-10-CM | POA: Diagnosis not present

## 2019-09-16 DIAGNOSIS — D485 Neoplasm of uncertain behavior of skin: Secondary | ICD-10-CM | POA: Diagnosis not present

## 2019-09-16 DIAGNOSIS — L570 Actinic keratosis: Secondary | ICD-10-CM | POA: Diagnosis not present

## 2019-09-16 DIAGNOSIS — L821 Other seborrheic keratosis: Secondary | ICD-10-CM | POA: Diagnosis not present

## 2019-09-16 DIAGNOSIS — L819 Disorder of pigmentation, unspecified: Secondary | ICD-10-CM | POA: Diagnosis not present

## 2019-10-12 ENCOUNTER — Other Ambulatory Visit: Payer: Self-pay | Admitting: Family Medicine

## 2019-10-12 DIAGNOSIS — Z1211 Encounter for screening for malignant neoplasm of colon: Secondary | ICD-10-CM

## 2019-12-22 ENCOUNTER — Other Ambulatory Visit: Payer: Self-pay | Admitting: Family Medicine

## 2019-12-22 DIAGNOSIS — I10 Essential (primary) hypertension: Secondary | ICD-10-CM

## 2020-01-10 ENCOUNTER — Other Ambulatory Visit: Payer: Self-pay | Admitting: Family Medicine

## 2020-01-10 DIAGNOSIS — I10 Essential (primary) hypertension: Secondary | ICD-10-CM

## 2020-01-10 NOTE — Telephone Encounter (Signed)
[facility] faxed refill request for the following medications:  losartan (COZAAR) 25 MG tablet   Please advise.  Thanks, [employer]

## 2020-01-10 NOTE — Telephone Encounter (Addendum)
Patient is no longer under prescriber's care. He moved to [place].

## 2020-01-12 ENCOUNTER — Other Ambulatory Visit: Payer: Self-pay | Admitting: Family Medicine

## 2020-01-12 DIAGNOSIS — I10 Essential (primary) hypertension: Secondary | ICD-10-CM

## 2020-01-12 NOTE — Telephone Encounter (Signed)
Pharmacy notified that patient moved to [place] and is no longer under Dr. Maralyn Sago care.

## 2020-01-12 NOTE — Telephone Encounter (Signed)
[facility] faxed refill request for the following medications:  losartan (COZAAR) 25 MG tablet  Please advise.  Thanks, [employer]

## 2020-02-23 ENCOUNTER — Other Ambulatory Visit: Payer: Self-pay | Admitting: Family Medicine

## 2020-02-23 NOTE — Telephone Encounter (Signed)
Requested  medications are  due for refill today yes  Requested medications are on the active medication list yes  Last refill 5/10  Future visit scheduled no  Last visit 9 months ago  Notes to clinic Failed protocol due to no visit within 6 months

## 2020-03-10 ENCOUNTER — Other Ambulatory Visit: Payer: Self-pay | Admitting: Family Medicine

## 2020-03-10 NOTE — Telephone Encounter (Signed)
Requested  medications are  due for refill today yes  Requested medications are on the active medication list yes  Last refill 5/10  Last visit 04/2019  Future visit scheduled NO  Notes to clinic Failed protocol of visit within 6 months and labs within 12 months, no upcoming appt scheduled

## 2020-03-12 ENCOUNTER — Other Ambulatory Visit: Payer: Self-pay | Admitting: Family Medicine

## 2020-04-04 ENCOUNTER — Telehealth: Payer: Self-pay | Admitting: Family Medicine

## 2020-04-04 ENCOUNTER — Telehealth: Payer: Self-pay

## 2020-04-04 NOTE — Telephone Encounter (Signed)
Copied from [place] [id number]. Topic: General - Other >> Apr 04, 2020  1:13 PM Rainey Pines A wrote: Pt called to notify office that he has moved out of state and will no longer be coming to practice

## 2020-04-04 NOTE — Telephone Encounter (Signed)
Copied from [place] [id number]. Topic: Medicare AWV >> Apr 04, 2020 10:11 AM Cher Nakai R wrote: Reason for CRM: Left message for patient to call back and schedule Medicare Annual Wellness Visit (AWV) either virtually or in office.  Last AWV 04/13/2019  Please schedule at anytime with [facility] Advisor.

## 2020-06-01 ENCOUNTER — Other Ambulatory Visit: Payer: Self-pay | Admitting: Family Medicine

## 2020-12-31 ENCOUNTER — Other Ambulatory Visit: Payer: Self-pay | Admitting: Family Medicine
